# Patient Record
Sex: Male | Born: 1970 | Race: White | Hispanic: No | Marital: Married | Smoking: Current every day smoker
Health system: Southern US, Community
[De-identification: ages and names within clinical notes are randomized; demographics above are authoritative.]

## PROBLEM LIST (undated history)

## (undated) DIAGNOSIS — J189 Pneumonia, unspecified organism: Secondary | ICD-10-CM

## (undated) DIAGNOSIS — Z87442 Personal history of urinary calculi: Secondary | ICD-10-CM

## (undated) DIAGNOSIS — E7800 Pure hypercholesterolemia, unspecified: Secondary | ICD-10-CM

## (undated) DIAGNOSIS — T4145XA Adverse effect of unspecified anesthetic, initial encounter: Secondary | ICD-10-CM

## (undated) DIAGNOSIS — K219 Gastro-esophageal reflux disease without esophagitis: Secondary | ICD-10-CM

## (undated) DIAGNOSIS — K859 Acute pancreatitis without necrosis or infection, unspecified: Secondary | ICD-10-CM

## (undated) DIAGNOSIS — T8859XA Other complications of anesthesia, initial encounter: Secondary | ICD-10-CM

## (undated) HISTORY — PX: OTHER SURGICAL HISTORY: SHX169

## (undated) HISTORY — PX: CHOLECYSTECTOMY: SHX55

## (undated) HISTORY — PX: COLONOSCOPY: SHX174

## (undated) HISTORY — PX: COLONOSCOPY W/ POLYPECTOMY: SHX1380

---

## 2001-07-30 DIAGNOSIS — J189 Pneumonia, unspecified organism: Secondary | ICD-10-CM

## 2001-07-30 HISTORY — DX: Pneumonia, unspecified organism: J18.9

## 2002-07-24 ENCOUNTER — Encounter: Payer: Self-pay | Admitting: Emergency Medicine

## 2002-07-24 ENCOUNTER — Emergency Department (HOSPITAL_COMMUNITY): Admission: EM | Admit: 2002-07-24 | Discharge: 2002-07-24 | Payer: Self-pay | Admitting: Emergency Medicine

## 2004-09-28 ENCOUNTER — Ambulatory Visit: Payer: Self-pay | Admitting: Internal Medicine

## 2004-09-29 ENCOUNTER — Ambulatory Visit: Payer: Self-pay | Admitting: Internal Medicine

## 2004-12-22 ENCOUNTER — Ambulatory Visit: Payer: Self-pay | Admitting: Internal Medicine

## 2005-03-09 ENCOUNTER — Ambulatory Visit: Payer: Self-pay | Admitting: Internal Medicine

## 2005-03-12 ENCOUNTER — Ambulatory Visit: Payer: Self-pay

## 2005-03-19 ENCOUNTER — Ambulatory Visit: Payer: Self-pay | Admitting: Gastroenterology

## 2005-03-20 ENCOUNTER — Ambulatory Visit: Payer: Self-pay | Admitting: Gastroenterology

## 2005-03-20 ENCOUNTER — Encounter (INDEPENDENT_AMBULATORY_CARE_PROVIDER_SITE_OTHER): Payer: Self-pay | Admitting: Specialist

## 2005-03-21 ENCOUNTER — Ambulatory Visit: Payer: Self-pay | Admitting: Gastroenterology

## 2005-03-23 ENCOUNTER — Ambulatory Visit: Payer: Self-pay | Admitting: Internal Medicine

## 2005-03-23 ENCOUNTER — Ambulatory Visit: Payer: Self-pay | Admitting: Gastroenterology

## 2005-03-24 ENCOUNTER — Ambulatory Visit: Payer: Self-pay | Admitting: Gastroenterology

## 2005-03-24 ENCOUNTER — Inpatient Hospital Stay (HOSPITAL_COMMUNITY): Admission: EM | Admit: 2005-03-24 | Discharge: 2005-03-26 | Payer: Self-pay | Admitting: Emergency Medicine

## 2005-03-26 ENCOUNTER — Ambulatory Visit: Payer: Self-pay | Admitting: Gastroenterology

## 2005-04-04 ENCOUNTER — Ambulatory Visit: Payer: Self-pay | Admitting: Internal Medicine

## 2005-04-09 ENCOUNTER — Ambulatory Visit: Payer: Self-pay | Admitting: Gastroenterology

## 2005-04-20 ENCOUNTER — Ambulatory Visit: Payer: Self-pay | Admitting: Gastroenterology

## 2005-05-25 ENCOUNTER — Ambulatory Visit (HOSPITAL_COMMUNITY): Admission: RE | Admit: 2005-05-25 | Discharge: 2005-05-25 | Payer: Self-pay | Admitting: General Surgery

## 2005-05-25 ENCOUNTER — Encounter (INDEPENDENT_AMBULATORY_CARE_PROVIDER_SITE_OTHER): Payer: Self-pay | Admitting: *Deleted

## 2005-12-28 ENCOUNTER — Ambulatory Visit: Payer: Self-pay | Admitting: Internal Medicine

## 2010-08-20 ENCOUNTER — Encounter: Payer: Self-pay | Admitting: Gastroenterology

## 2010-12-15 NOTE — Op Note (Signed)
NAMEDRADEN, COTTINGHAM                ACCOUNT NO.:  [id number]   MEDICAL RECORD NO.:  [id number]          PATIENT TYPE:  AMB   LOCATION:  SDS                          FACILITY:  [facility]   PHYSICIAN:  Angelia Mould. Derrell Lolling, M.D.DATE OF BIRTH:  Mar 19, 1971   DATE OF PROCEDURE:  05/25/2005  DATE OF DISCHARGE:                                 OPERATIVE REPORT   PREOPERATIVE DIAGNOSIS:  Biliary dyskinesia with history of pancreatitis.   POSTOPERATIVE DIAGNOSIS:  Biliary dyskinesia with history of pancreatitis.   OPERATION PERFORMED:  Laparoscopic cholecystectomy with intraoperative  cholangiogram.   SURGEON:  Claud Kelp, M.D.   FIRST ASSISTANT:  Manus Rudd, M.D.   OPERATIVE INDICATIONS:  This is a 40 year old white man in good health  In  August of this year, he had severe epigastric and right upper quadrant pain  requiring hospitalization for 3-1/2 days.  A CT scan shows some vague  haziness at the head of the pancreas associated elevated lipase and mildly  elevated SGPT, and he was thought to have mild pancreatitis.  His  gallbladder ultrasound was negative, but a hepatobiliary scan was markedly  abnormal, in that there was a 0% ejection fraction at 60 minutes.  He was  evaluated by Dr. Claudette Head.  He felt that the patient had biliary  pancreatitis.  He was referred for cholecystectomy electively and he is  operated upon electively.   OPERATIVE FINDINGS:  The gallbladder was chronically inflamed, somewhat  thick-walled and had adhesions to the body and infundibulum, suggesting  prior inflammatory episodes.  The cholangiogram showed a normal biliary  tree, both intrahepatic and extrahepatic, no filling defects, and no  obstruction with good flow of contrast into the duodenum.  The  intrapancreatic bile duct was smooth and tapered, and looked fine.  The  stomach, duodenum, large intestine, small intestine and peritoneal surfaces  were normal to inspection.   OPERATIVE TECHNIQUE:   Following the induction of general endotracheal  anesthesia, the patient's abdomen was prepped and draped in a sterile  fashion.  0.5% Marcaine with epinephrine was used as a local infiltration  anesthetic.  A 10-mm Hasson trocar was inserted through a small vertical  incision just above the umbilicus under direct vision.  This was secured  with a pursestring suture of 0 Vicryl.  Pneumoperitoneum was created and a  video camera was inserted.  A 10-mm trocar was placed in the subxiphoid  region.  Two 5-mm trocars were placed in the right mid-abdomen.  The  gallbladder fundus was identified and elevated.  We spent some time taking  down the adhesions, but ultimately we could identify the infundibulum of the  gallbladder and retract it laterally.  There were some adhesions of the  duodenum to the gallbladder, but they were very soft and easy to take down.  We dissected out the cystic duct and the cystic artery.  The cystic artery  was isolated and secured multiple metal clips and divided close to the  gallbladder.  A nice window was created behind the cystic duct and then the  cystic duct was clipped with a  metal clip close to the gallbladder.  A  cholangiogram catheter was inserted into the cystic duct.  A cholangiogram  was obtained using the C-arm.  The cholangiogram showed normal intrahepatic  and extrahepatic biliary anatomy, no filling defects, and good flow of  contrast into the duodenum without any pressure whatsoever.  The  cholangiogram catheter was removed.  The cystic duct was secured with  multiple metal clips and divided.  The gallbladder was dissected from its  bed and removed through the umbilical port.  The operative field was  copiously irrigated and inspected repeatedly.  At the completion of the  case, there was no bleeding and no bile leak whatsoever and the irrigation  fluid was completely clear.  The trocars were removed under direct vision  and there was no bleeding  from the trocar sites.  Pneumoperitoneum was  released.  The fascia at the umbilicus was closed with 0 Vicryl sutures.  Skin incision were closed with subcuticular sutures of 4-0 Monocryl and  Steri-Strips.  Clean bandages were placed and the patient taken to the  recovery room in stable condition.  Estimated blood loss was about 10 mL.  Complications -- none.  Sponge and instrument counts were correct.      Angelia Mould. Derrell Lolling, M.D.  Electronically Signed     HMI/MEDQ  D:  05/25/2005  T:  05/26/2005  Job:  [id number]   cc:   Wanda Plump, MD [facility]  [phone] [place], [place] [address]   Venita Lick. Russella Dar, M.D. [facility]  [address]  [place]  [place] [address]

## 2010-12-15 NOTE — Discharge Summary (Signed)
[name], [name]                ACCOUNT NO.:  [id number]   MEDICAL RECORD NO.:  [id number]          PATIENT TYPE:  INP   LOCATION:  1511                         [facility]ACILITY[facility]  [facility]   PHYSICIAN:  Rene Paci, M.D. LHCDATE OF BIRTH:  February 13, 1971   DATE OF ADMISSION:  03/25/2007  DATE OF DISCHARGE:  03/26/2005                                 DISCHARGE SUMMARY   DISCHARGE DIAGNOSES:  1.  Acute pancreatitis presumed secondary to microlithiasis, to remain off      Statin as prior to admission.  2.  Dyslipidemia.   DISCHARGE MEDICATIONS:  Ultram as needed, Flexeril as needed, Tylenol as  needed and Prevacid 10 mg daily as needed.   DISPOSITION:  The patient is discharged home tolerating a regular diet,  medically stable condition. Pain, nausea, vomiting have resolved.  Understands plans for follow-up.   FOLLOW-UP:  With primary care physician Dr. Willow Ora for next week September  6 to reevaluate other treatment for dyslipidemia as well as symptoms of  abdominal pain related to pancreatitis. See details below for elective  cholecystectomy to be arranged with surgery in the next 2-3 weeks.   CONSULTATIONS:  [facility], Dr. Marina Goodell.   TESTS:  CT abdomen and pelvis on August 25 showing pancreatic head  pancreatitis. No other acute abnormality within the abdomen or pelvis. HIDA  __________ scan pending at time of discharge, but his symptoms resolved and  for elective cholecystectomy final results will need to be followed up by  primary MD.   HOSPITAL COURSE BY PROBLEM:  PANCREATITIS.  The patient is a pleasant 40-  year-old gentleman who had been taking Vytoran for his dyslipidemia but off  this medication for 3 weeks secondary to question reactions. Seen in  outpatient evaluation by Dr. Marina Goodell for the same and for continued outpatient  workup when he experienced a sudden increase in the intensity of his  abdominal pain associated with nausea. Found in the ER to have elevated  amylase  and lipase consistent with biochemical pancreatitis despite being  off the Statin and this was confirmed with pancreatic head pancreatic  changes on CT abdomen and pelvis. He was seen in consultation by GI and  admitted for pain control. The patient was left n.p.o. pending the  improvement of the symptoms and a HIDA scan was scheduled to rule out  microlithiasis given persistent pancreatitis despite discontinuation of  Statin. He underwent a HIDA scan on the day of discharge and has he has had  no further pain, nausea or vomiting for to days and tolerating a regular low-  fat diet, he is felt stable for discharge home with outpatient follow-up  with surgery to consider elective cholecystectomy given persistent  pancreatitis symptoms requiring hospitalization. He is to remain off the  Statin and further follow-up primary MD regarding other treatment of his  dyslipidemia.   LABORATORY DATA AT TIME OF DISCHARGE:  On August 27 the day before  discharge, lipase decreased to 145, CMET normal with the exception of ALT  mildly elevated at 42. All other LFTs normal. CBC normal with a white count  of 9.5. Hemoglobin 14.7 and platelet count of 285.      Rene Paci, M.D. [facility]  Electronically Signed     VL/MEDQ  D:  03/26/2005  T:  03/27/2005  Job:  [id number]

## 2010-12-15 NOTE — H&P (Signed)
[name], Downs NO.:  [id number]   MEDICAL RECORD NO.:  [id number]          PATIENT TYPE:  EMS   LOCATION:  ED                           FACILITY:  [facility]   PHYSICIAN:  Corwin Levins, M.D. LHCDATE OF BIRTH:  12-18-1970   DATE OF ADMISSION:  03/23/2005  DATE OF DISCHARGE:                                HISTORY & PHYSICAL   CHIEF COMPLAINT:  Fever for three weeks with increasing abdominal pain after  stopping Vytorin 2-1/2 weeks ago.   HISTORY OF PRESENT ILLNESS:  Mr. [name] is a 40 year old white male here with  the above.  His lipase was approximately 200 on August 23, but apparently  the patient was not informed.  He came to the ER tonight with increasingly  unbearable pain radiating to the back.  He was found to have similar labs as  on August 23.  CT of the pancreas showed pancreatitis.  He has had some  nausea but no vomiting.  No orthostatic symptoms.  He has been around the ER  after IV fluids and morphine sulfate.  He had a recent EGD, and ultrasound  was apparently negative, per patient, per Dr. Russella Dar.   PAST MEDICAL HISTORY:  1.  Renal stone.  2.  Hyperlipidemia.  3.  History of colon polyps.   PAST SURGICAL HISTORY:  Status post birthmark surgery, forehead, remotely.   ALLERGIES:  No known drug allergies.   CURRENT MEDICATIONS:  Recent cyclobenzaprine, Ultram and Tylenol, none of  which he has taken in the last 24 hours.   SOCIAL HISTORY:  No tobacco or alcohol.  Lives with wife.  Married.   FAMILY HISTORY:  Negative for pancreatic.   REVIEW OF SYSTEMS:  Otherwise noncontributory.   PHYSICAL EXAMINATION:  GENERAL:  [name] is a 40 year old white male who  is a little groggy after morphine but able to give concise answers.  VITAL SIGNS:  Blood pressure 132/94, heart rate 94, respirations 20.  ENT:  Sclerae are clear.  TM's are clear.  Pharynx benign.  NECK:  No lymph nodes or JVD.  No thyromegaly.  CHEST:  No rales or wheezes.  CARDIAC:  Regular rate and rhythm.  ABDOMEN:  Soft.  Somewhat decreased bowel sounds.  Moderately tender right  upper and left upper quadrants.  No guarding.  EXTREMITIES:  No edema.   LABS:  Lipase 205.  UA negative.  BMET within normal limits except glucose  122.  LFTs within normal limits except SGPT 75.  White blood cell count 9.8,  hemoglobin 16.3.   CT of the abdomen and pelvis consistent with pancreatic head pancreatitis  only, otherwise negative.   ASSESSMENT/PLAN:  Abdominal pain with increasing lipase and CT, as above,  consistent with pancreatitis.  Confirmed by CT.  Apparently, no likelihood  by history of gallstones or alcohol relation.  I therefore think it is most  likely related to Vytorin versus idiopathic.  He is to be admitted.  Given  IV fluids.  Pain control.  Make him n.p.o.  Consider GI disease with  questionable need for further imaging such  as ERCP.           ______________________________  Corwin Levins, M.D. [facility]     JWJ/MEDQ  D:  03/23/2005  T:  03/23/2005  Job:  [id number]   cc:   Wanda Plump, MD [facility]  [phone] [place], [place] [address]   Venita Lick. Russella Dar, M.D. [facility]

## 2014-01-16 ENCOUNTER — Emergency Department (HOSPITAL_COMMUNITY)
Admission: EM | Admit: 2014-01-16 | Discharge: 2014-01-16 | Disposition: A | Discharge status: Home / Self Care | Payer: Managed Care, Other (non HMO) | Attending: Emergency Medicine | Admitting: Emergency Medicine

## 2014-01-16 ENCOUNTER — Emergency Department (HOSPITAL_COMMUNITY): Payer: Managed Care, Other (non HMO)

## 2014-01-16 ENCOUNTER — Encounter (HOSPITAL_COMMUNITY): Payer: Self-pay | Admitting: Emergency Medicine

## 2014-01-16 DIAGNOSIS — Z9089 Acquired absence of other organs: Secondary | ICD-10-CM | POA: Insufficient documentation

## 2014-01-16 DIAGNOSIS — K8500 Idiopathic acute pancreatitis without necrosis or infection: Secondary | ICD-10-CM

## 2014-01-16 DIAGNOSIS — F17200 Nicotine dependence, unspecified, uncomplicated: Secondary | ICD-10-CM | POA: Insufficient documentation

## 2014-01-16 DIAGNOSIS — E7800 Pure hypercholesterolemia, unspecified: Secondary | ICD-10-CM | POA: Insufficient documentation

## 2014-01-16 DIAGNOSIS — K859 Acute pancreatitis without necrosis or infection, unspecified: Secondary | ICD-10-CM | POA: Insufficient documentation

## 2014-01-16 DIAGNOSIS — Z9889 Other specified postprocedural states: Secondary | ICD-10-CM | POA: Insufficient documentation

## 2014-01-16 HISTORY — DX: Acute pancreatitis without necrosis or infection, unspecified: K85.90

## 2014-01-16 HISTORY — DX: Pure hypercholesterolemia, unspecified: E78.00

## 2014-01-16 HISTORY — DX: Gastro-esophageal reflux disease without esophagitis: K21.9

## 2014-01-16 LAB — COMPREHENSIVE METABOLIC PANEL
ALT: 14 U/L (ref 0–53)
AST: 12 U/L (ref 0–37)
Albumin: 3.6 g/dL (ref 3.5–5.2)
Alkaline Phosphatase: 83 U/L (ref 39–117)
BUN: 5 mg/dL — ABNORMAL LOW (ref 6–23)
CO2: 23 mEq/L (ref 19–32)
Calcium: 9.4 mg/dL (ref 8.4–10.5)
Chloride: 100 mEq/L (ref 96–112)
Creatinine, Ser: 0.67 mg/dL (ref 0.50–1.35)
GLUCOSE: 84 mg/dL (ref 70–99)
Potassium: 3.7 mEq/L (ref 3.7–5.3)
Sodium: 137 mEq/L (ref 137–147)
Total Bilirubin: 0.4 mg/dL (ref 0.3–1.2)
Total Protein: 7.5 g/dL (ref 6.0–8.3)

## 2014-01-16 LAB — URINALYSIS, ROUTINE W REFLEX MICROSCOPIC
Bilirubin Urine: NEGATIVE
Glucose, UA: NEGATIVE mg/dL
Hgb urine dipstick: NEGATIVE
Ketones, ur: 15 mg/dL — AB
LEUKOCYTES UA: NEGATIVE
NITRITE: NEGATIVE
PROTEIN: NEGATIVE mg/dL
Specific Gravity, Urine: 1.005 (ref 1.005–1.030)
UROBILINOGEN UA: 0.2 mg/dL (ref 0.0–1.0)
pH: 6.5 (ref 5.0–8.0)

## 2014-01-16 LAB — CBC
HCT: 44.3 % (ref 39.0–52.0)
Hemoglobin: 15.7 g/dL (ref 13.0–17.0)
MCH: 28.6 pg (ref 26.0–34.0)
MCHC: 35.4 g/dL (ref 30.0–36.0)
MCV: 80.8 fL (ref 78.0–100.0)
PLATELETS: 277 10*3/uL (ref 150–400)
RBC: 5.48 MIL/uL (ref 4.22–5.81)
RDW: 12 % (ref 11.5–15.5)
WBC: 9.1 10*3/uL (ref 4.0–10.5)

## 2014-01-16 LAB — LIPASE, BLOOD: LIPASE: 482 U/L — AB (ref 11–59)

## 2014-01-16 MED ORDER — SODIUM CHLORIDE 0.9 % IV BOLUS (SEPSIS)
500.0000 mL | Freq: Once | INTRAVENOUS | Status: AC
  Administered 2014-01-16: 500 mL via INTRAVENOUS

## 2014-01-16 MED ORDER — OXYCODONE-ACETAMINOPHEN 5-325 MG PO TABS: 1.0000 | ORAL_TABLET | Freq: Four times a day (QID) | ORAL | Status: DC | PRN

## 2014-01-16 MED ORDER — HYDROMORPHONE HCL PF 1 MG/ML IJ SOLN
1.0000 mg | Freq: Once | INTRAMUSCULAR | Status: AC
  Administered 2014-01-16: 1 mg via INTRAVENOUS
  Filled 2014-01-16: qty 1

## 2014-01-16 MED ORDER — ONDANSETRON 8 MG PO TBDP: 8.0000 mg | ORAL_TABLET | Freq: Three times a day (TID) | ORAL | Status: DC | PRN

## 2014-01-16 MED ORDER — IOHEXOL 300 MG/ML  SOLN
100.0000 mL | Freq: Once | INTRAMUSCULAR | Status: AC | PRN
  Administered 2014-01-16: 100 mL via INTRAVENOUS

## 2014-01-16 MED ORDER — SODIUM CHLORIDE 0.9 % IV BOLUS (SEPSIS)
1000.0000 mL | Freq: Once | INTRAVENOUS | Status: AC
  Administered 2014-01-16: 1000 mL via INTRAVENOUS

## 2014-01-16 MED ORDER — ONDANSETRON HCL 4 MG/2ML IJ SOLN
4.0000 mg | Freq: Once | INTRAMUSCULAR | Status: AC
  Administered 2014-01-16: 4 mg via INTRAVENOUS
  Filled 2014-01-16: qty 2

## 2014-01-16 MED ORDER — IOHEXOL 300 MG/ML  SOLN
50.0000 mL | Freq: Once | INTRAMUSCULAR | Status: AC | PRN
  Administered 2014-01-16: 50 mL via ORAL

## 2014-01-16 NOTE — ED Provider Notes (Addendum)
CSN: [id number]     Arrival date & time 01/16/14  1635 History   First MD Initiated Contact with Patient 01/16/14 1657     Chief Complaint  Patient presents with  . Flank Pain     (Consider location/radiation/quality/duration/timing/severity/associated sxs/prior Treatment) Patient is a 43 y.o. male presenting with flank pain. The history is provided by the patient and the spouse.  Flank Pain Pertinent negatives include no chest pain, no abdominal pain, no headaches and no shortness of breath.  pt w hx pancreatitis, hx kidney stone, c/o upper abd and left flank pain in the past week. Constant, but waxes and wanes in severity. Occurs at rest. No relation to activity or exertion. No relation to position. Nausea. No vomiting. Having normal bms. No diarrhea or constipation. No dysuria or hematuria. No pleuritic pain or chest pain. No cough, or uri c/o. No sob. States was seen by pcp and told lipase high and suspected pancreatitis. Has been on brat diet, pain rx, but states pain no better/worse. Denies fever or chills. No acute or abrupt worsening in pain today, but persistent.  Only prior abd surgery is remote hx cholecystectomy.     Past Medical History  Diagnosis Date  . Pancreatitis   . Hypercholesteremia   . GERD (gastroesophageal reflux disease)    Past Surgical History  Procedure Laterality Date  . Cholecystectomy    . Colonoscopy     No family history on file. History  Substance Use Topics  . Smoking status: Current Every Day Smoker -- 0.50 packs/day    Types: Cigarettes  . Smokeless tobacco: Not on file  . Alcohol Use: Yes     Comment: socially    Review of Systems  Constitutional: Negative for fever.  HENT: Negative for sore throat.   Eyes: Negative for redness.  Respiratory: Negative for cough and shortness of breath.   Cardiovascular: Negative for chest pain and leg swelling.  Gastrointestinal: Negative for vomiting, abdominal pain and diarrhea.  Genitourinary:  Positive for flank pain. Negative for dysuria and hematuria.  Musculoskeletal: Negative for back pain and neck pain.  Skin: Negative for rash.  Neurological: Negative for headaches.  Hematological: Does not bruise/bleed easily.  Psychiatric/Behavioral: Negative for confusion.      Allergies  Codeine  Home Medications   Prior to Admission medications   Not on File   BP 132/90  Pulse 88  Temp(Src) 98.1 F (36.7 C) (Oral)  Resp 16  SpO2 97% Physical Exam  Nursing note and vitals reviewed. Constitutional: He is oriented to person, place, and time. He appears well-developed and well-nourished. No distress.  HENT:  Mouth/Throat: Oropharynx is clear and moist.  Eyes: Conjunctivae are normal. No scleral icterus.  Neck: Neck supple. No tracheal deviation present.  Cardiovascular: Normal rate, regular rhythm, normal heart sounds and intact distal pulses.   Pulmonary/Chest: Effort normal and breath sounds normal. No accessory muscle usage. No respiratory distress.  Abdominal: Soft. Bowel sounds are normal. He exhibits no distension and no mass. There is tenderness. There is no rebound and no guarding.  Epigastric and luq tenderness.   Genitourinary:  No cva tenderness  Musculoskeletal: Normal range of motion. He exhibits no edema and no tenderness.  Neurological: He is alert and oriented to person, place, and time.  Skin: Skin is warm and dry. No rash noted. He is not diaphoretic.  Psychiatric: He has a normal mood and affect.    ED Course  Procedures (including critical care time) Labs Review  Results  for orders placed during the hospital encounter of 01/16/14  CBC      Result Value Ref Range   WBC 9.1  4.0 - 10.5 K/uL   RBC 5.48  4.22 - 5.81 MIL/uL   Hemoglobin 15.7  13.0 - 17.0 g/dL   HCT 16.144.3  09.639.0 - 04.552.0 %   MCV 80.8  78.0 - 100.0 fL   MCH 28.6  26.0 - 34.0 pg   MCHC 35.4  30.0 - 36.0 g/dL   RDW 40.912.0  81.111.5 - 91.415.5 %   Platelets 277  150 - 400 K/uL  COMPREHENSIVE  METABOLIC PANEL      Result Value Ref Range   Sodium 137  137 - 147 mEq/L   Potassium 3.7  3.7 - 5.3 mEq/L   Chloride 100  96 - 112 mEq/L   CO2 23  19 - 32 mEq/L   Glucose, Bld 84  70 - 99 mg/dL   BUN 5 (*) 6 - 23 mg/dL   Creatinine, Ser 7.820.67  0.50 - 1.35 mg/dL   Calcium 9.4  8.4 - 95.610.5 mg/dL   Total Protein 7.5  6.0 - 8.3 g/dL   Albumin 3.6  3.5 - 5.2 g/dL   AST 12  0 - 37 U/L   ALT 14  0 - 53 U/L   Alkaline Phosphatase 83  39 - 117 U/L   Total Bilirubin 0.4  0.3 - 1.2 mg/dL   GFR calc non Af Amer >90  >90 mL/min   GFR calc Af Amer >90  >90 mL/min  LIPASE, BLOOD      Result Value Ref Range   Lipase 482 (*) 11 - 59 U/L  URINALYSIS, ROUTINE W REFLEX MICROSCOPIC      Result Value Ref Range   Color, Urine YELLOW  YELLOW   APPearance CLEAR  CLEAR   Specific Gravity, Urine 1.005  1.005 - 1.030   pH 6.5  5.0 - 8.0   Glucose, UA NEGATIVE  NEGATIVE mg/dL   Hgb urine dipstick NEGATIVE  NEGATIVE   Bilirubin Urine NEGATIVE  NEGATIVE   Ketones, ur 15 (*) NEGATIVE mg/dL   Protein, ur NEGATIVE  NEGATIVE mg/dL   Urobilinogen, UA 0.2  0.0 - 1.0 mg/dL   Nitrite NEGATIVE  NEGATIVE   Leukocytes, UA NEGATIVE  NEGATIVE   Ct Abdomen Pelvis W Contrast  01/16/2014   CLINICAL DATA:  Left flank pain pancreatitis.  EXAM: CT ABDOMEN AND PELVIS WITH CONTRAST  TECHNIQUE: Multidetector CT imaging of the abdomen and pelvis was performed using the standard protocol following bolus administration of intravenous contrast.  CONTRAST:  50mL OMNIPAQUE IOHEXOL 300 MG/ML SOLN, 100mL OMNIPAQUE IOHEXOL 300 MG/ML SOLN  COMPARISON:  CT 03/23/2005  FINDINGS: Lung bases are clear.  No pericardial fluid.  No focal hepatic lesion. Post cholecystectomy. No intrahepatic biliary duct dilatation. The common bile duct is normal caliber. There is mild inflammation surrounding the pancreas body and tail. No organized fluid collections. Normal pancreatic enhancement.  The spleen, adrenal glands, and kidneys are normal.  The stomach,  small bowel, and colon are unremarkable.  Abdominal aorta is normal caliber. No retroperitoneal periportal lymphadenopathy. No free fluid the pelvis. The prostate gland and bladder normal. No pelvic lymphadenopathy. No aggressive osseous lesion.  IMPRESSION: 1. Mild acute pancreatitis. No organized fluid collection. No vascular complication. No evidence of pancreatic necrosis. 2. Patient status post cholecystectomy.   Electronically Signed   By: Genevive BiStewart  Edmunds M.D.   On: 01/16/2014 18:45  MDM  Iv ns bolus. zofran iv. Dilaudid 1 mg iv. Pt has ride, does not have to drive.  Labs. Imaging.  Reviewed nursing notes and prior charts for additional history.   Recheck pt feels much improved. No nv. abd soft nt.  Discussed labs and ct.  Pt appears stable for d/c.      Suzi Roots, MD 01/16/14 [phone]

## 2014-01-16 NOTE — ED Notes (Signed)
Pt from home c/o L flank pain. Pt states that he has had abd pain since June 11th. Pt saw PCP 3 days ago and was dx with pancreatitis. Pt has been trying to tx at home with BRAT diet w/o success. Pt takes 800mg  ibuprofen every 8 hrs for pain but is not controlling pain. Pt is here because he "is tired of hurting." Pt denies diarrhea, emesis, CP, SOB. Pt is A&O and in NAD. Pt spouse at bedside

## 2014-01-16 NOTE — Discharge Instructions (Signed)
Rest. Drink plenty of fluids, clear liquid diet, advance diet as tolerated. You may take percocet as need for pain. No driving for the next 6 hours or when taking percocet. Also, do not take tylenol or acetaminophen containing medication when taking percocet.  Take zofran as need for nausea. Follow up with primary care doctor in coming week if symptoms fail to improve/resolve. Return to ER if worse, new symptoms, fevers, persistent vomiting, other concern.    Acute Pancreatitis Acute pancreatitis is a disease in which the pancreas becomes suddenly inflamed. The pancreas is a large gland located behind your stomach. The pancreas produces enzymes that help digest food. The pancreas also releases the hormones glucagon and insulin that help regulate blood sugar. Damage to the pancreas occurs when the digestive enzymes from the pancreas are activated and begin attacking the pancreas before being released into the intestine. Most acute attacks last a couple of days. CAUSES  Pancreatitis can happen to anyone. In some cases, the cause is unknown. Most cases are caused by:  Alcohol abuse.  Gallstones. Other less common causes are:  Certain medicines.  Exposure to certain chemicals.  Infection.  Damage caused by an accident (trauma).  Abdominal surgery. SYMPTOMS   Pain in the upper abdomen that may radiate to the back.  Tenderness and swelling of the abdomen.  Nausea and vomiting. DIAGNOSIS  Your caregiver will perform a physical exam. Blood and stool tests may be done to confirm the diagnosis. Imaging tests may also be done, such as X-rays, CT scans, or an ultrasound of the abdomen. TREATMENT  Treatment usually requires a stay in the hospital. Treatment may include:  Pain medicine.  Fluid replacement through an intravenous line (IV).  Placing a tube in the stomach to remove stomach contents and control vomiting.  Not eating for 3 or 4 days. This gives your pancreas a rest, because  enzymes are not being produced that can cause further damage.  Antibiotic medicines if your condition is caused by an infection.  Surgery of the pancreas or gallbladder. HOME CARE INSTRUCTIONS   Follow the diet advised by your caregiver. This may involve avoiding alcohol and decreasing the amount of fat in your diet.  Eat smaller, more frequent meals. This reduces the amount of digestive juices the pancreas produces.  Drink enough fluids to keep your urine clear or pale yellow.  Only take over-the-counter or prescription medicines as directed by your caregiver.  Avoid drinking alcohol if it caused your condition.  Do not smoke.  Get plenty of rest.  Check your blood sugar at home as directed by your caregiver.  Keep all follow-up appointments as directed by your caregiver. SEEK MEDICAL CARE IF:   You do not recover as quickly as expected.  You develop new or worsening symptoms.  You have persistent pain, weakness, or nausea.  You recover and then have another episode of pain. SEEK IMMEDIATE MEDICAL CARE IF:   You are unable to eat or keep fluids down.  Your pain becomes severe.  You have a fever or persistent symptoms for more than 2 to 3 days.  You have a fever and your symptoms suddenly get worse.  Your skin or the white part of your eyes turn yellow (jaundice).  You develop vomiting.  You feel dizzy, or you faint.  Your blood sugar is high (over 300 mg/dL). MAKE SURE YOU:   Understand these instructions.  Will watch your condition.  Will get help right away if you are not doing  well or get worse. Document Released: 07/16/2005 Document Revised: 01/15/2012 Document Reviewed: 10/25/2011 [facility] Patient Information 2015 [place], [place]. This information is not intended to replace advice given to you by your health care provider. Make sure you discuss any questions you have with your health care provider.

## 2014-01-16 NOTE — ED Notes (Signed)
Pt is aware that a urine sample is needed.  

## 2016-07-17 ENCOUNTER — Ambulatory Visit: Payer: Managed Care, Other (non HMO) | Attending: Orthopedic Surgery | Admitting: Physical Therapy

## 2016-07-17 ENCOUNTER — Encounter: Payer: Self-pay | Admitting: Physical Therapy

## 2016-07-17 DIAGNOSIS — M542 Cervicalgia: Secondary | ICD-10-CM | POA: Diagnosis present

## 2016-07-17 DIAGNOSIS — M62830 Muscle spasm of back: Secondary | ICD-10-CM | POA: Insufficient documentation

## 2016-07-17 NOTE — Therapy (Signed)
[facility] [place] [facility] [address] [place], [place], [address] Phone: [phone]   Fax:  [phone]  Physical Therapy Evaluation  Patient Details  Name: [name] MRN: [id number] Date of Birth: [birth year] Referring Provider: Dr. Ranell Patrick  Encounter Date: 07/17/2016      PT End of Session - 07/17/16 1151    Visit Number 1   Date for PT Re-Evaluation 09/17/16   PT Start Time 1100   PT Stop Time 1200   PT Time Calculation (min) 60 min   Activity Tolerance Patient tolerated treatment well;No increased pain   Behavior During Therapy WFL for tasks assessed/performed      Past Medical History:  Diagnosis Date  . GERD (gastroesophageal reflux disease)   . Hypercholesteremia   . Pancreatitis     Past Surgical History:  Procedure Laterality Date  . CHOLECYSTECTOMY    . COLONOSCOPY      There were no vitals filed for this visit.       Subjective Assessment - 07/17/16 1057    Subjective Pt states that his neck pain begins in the shoulder blades (pointing around CT junction) and it radiates down the left arm down to the 4th and 5th digit. 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 These symptoms began abround a year and a half ago however this episode began around Thanksgiving. He tried steroid treatment in April 2016. This alleviated symptoms fully for about 7-8 months.  The pain is worst in the morning and the pain decreases throughout the day. At the end of the pain begins to get progressively worse again.   Limitations Other (comment)  sleeping   Currently in Pain? Yes   Pain Score 2   at worst: 7/10   Pain Location Neck   Pain Orientation Left   Pain Descriptors / Indicators Aching   Pain Type Other (Comment)  acute on chronic   Pain Radiating Towards left arm to 4th and 5th digits   Pain Onset More than a month ago   Pain Frequency Constant    Aggravating Factors  sleeping, fatigue   Pain Relieving Factors side bending, steroids (currently not taking), medication   Multiple Pain Sites Yes   Pain Score 3   Pain Location Shoulder   Pain Orientation Left   Pain Descriptors / Indicators Radiating            [facility] PT Assessment - 07/17/16 0001      Assessment   Medical Diagnosis Cervicalgia   Referring Provider Dr. Joneen Boers Dominance Right   Next MD Visit 07/26/16   Prior Therapy no     Balance Screen   Has the patient fallen in the past 6 months No   Has the patient had a decrease in activity level because of a fear of falling?  No   Is the patient reluctant to leave their home because of a fear of falling?  No     Prior Function   Vocation Full time employment   Market researcher, sititng behind desk, flying on planes alot   Leisure riding dirt bikes with kids     ROM / Strength   AROM / PROM / Strength AROM;Strength     AROM   Overall AROM  Unable to assess   Overall AROM Comments Cervical extension caused worst pain and flexion caused slight increase but then stopped, all  the movement is causing radiating symptoms.   AROM Assessment Site Cervical;Shoulder   Cervical Flexion 30   Cervical Extension 45   Cervical - Right Side Bend 55   Cervical - Left Side Bend 55   Cervical - Right Rotation 70   Cervical - Left Rotation 70     Palpation   Spinal mobility TTP CT junction   Palpation comment TTP left scapula region over infraspinatus and mid trap                   [facility] - 07/17/16 0001      Exercises   Exercises Shoulder;Neck     Shoulder Exercises: Stretch   Corner Stretch 20 seconds;1 rep     Modalities   Modalities Electrical Stimulation;Moist Heat     Moist Heat Therapy   Number Minutes Moist Heat 15 Minutes   Moist Heat Location Cervical     Electrical Stimulation   Electrical Stimulation Location left scap, left cervical    Electrical Stimulation Action IFC   Electrical Stimulation Parameters supine   Electrical Stimulation Goals Pain     Manual Therapy   Manual Therapy Passive ROM   Passive ROM Upper trap stretch, suboccipital release                PT Education - 07/17/16 1151    Education provided Yes   Education Details HEP with trap stretches, chin tuck, pec stretches   Person(s) Educated Patient   Methods Explanation;Handout;Demonstration   Comprehension Verbalized understanding          PT Short Term Goals - 07/17/16 1210      PT SHORT TERM GOAL #1   Title Pt will demonstrtae proper recall of HEP.   Time 1   Period Weeks   Status New           PT Long Term Goals - 07/17/16 1210      PT LONG TERM GOAL #1   Title Pt will report 50% decrease in worst pain rating.   Time 8   Period Weeks   Status New     PT LONG TERM GOAL #2   Title Pt will understand workspace and daily ergonomics in order to decrease stress felt on cervical neck region.   Time 8   Period Weeks   Status New     PT LONG TERM GOAL #3   Title Pt will be able to work a full day without reports of fatigue and symptom increase at the end of the day.   Time 8   Period Weeks   Status New               Plan - 07/17/16 1152    Clinical Impression Statement Pt presents with neck pain which is mainly on the left side and radiates down the left arm at times. Pt lacks some AROM with extension which also caused the most  Pt was educated on Training and development officerworkspace ergonomics and daily eronomic changes that may decrease the amount of stress throuhg the neck region due to constant neck flexion and looking downward. Pt reported decreased symptoms while on the estim machine however when he sat up, the radiating symptoms of the left UE came back. The pain was localized to the lateral portion of the upper arm. Trigger points were felt in the left upper scapular region and he stated that this is where he feels the pain originates  from. Upper limb tension tests were  not fully tested however ulnar nerve strtech was negative for symptom provocation.    Rehab Potential Good   PT Frequency 2x / week   PT Duration 8 weeks   PT Treatment/Interventions Moist Heat;Traction;ADLs/Self Care Home Management;Electrical Stimulation;Cryotherapy;Functional mobility training;Therapeutic activities;Therapeutic exercise;Patient/family education;Passive range of motion;Manual techniques;Energy conservation   PT Next Visit Plan ULT tests, distraction vs compression, scapular stabilization exercises, chin tucks, pain management   PT Home Exercise Plan Upper trap stretch, chin tucks   Consulted and Agree with Plan of Care Patient      Patient will benefit from skilled therapeutic intervention in order to improve the following deficits and impairments:  Pain, Postural dysfunction, Improper body mechanics, Increased muscle spasms, Impaired flexibility, Hypomobility, Decreased range of motion, Decreased mobility  Visit Diagnosis: Cervicalgia  Muscle spasm of back     Problem List There are no active problems to display for this patient.   Arsenio LoaderBrian Cade Lindstromsley, SPT 07/17/2016, 12:15 PM  [facility] [place] [facility] [address] [place], [place], [address] Phone: [phone]   Fax:  [phone]  Name: [name] MRN: [id number] Date of Birth: 11/05/1970

## 2016-07-19 ENCOUNTER — Ambulatory Visit: Payer: Managed Care, Other (non HMO) | Admitting: Physical Therapy

## 2016-07-19 ENCOUNTER — Encounter: Payer: Self-pay | Admitting: Physical Therapy

## 2016-07-19 DIAGNOSIS — M542 Cervicalgia: Secondary | ICD-10-CM

## 2016-07-19 DIAGNOSIS — M62830 Muscle spasm of back: Secondary | ICD-10-CM

## 2016-07-19 NOTE — Therapy (Signed)
[facility] [place] [address] [place], [place], [address] Phone: [phone]   Fax:  [phone]  Physical Therapy Treatment  Patient Details  Name: [name] MRN: [id number] Date of Birth: 05/10/1971 Referring Provider: Dr. Ranell PatrickNorris  Encounter Date: 07/19/2016      PT End of Session - 07/19/16 1550    Visit Number 2   Date for PT Re-Evaluation 09/17/16   PT Start Time 1525   PT Stop Time 1620   PT Time Calculation (min) 55 min      Past Medical History:  Diagnosis Date  . GERD (gastroesophageal reflux disease)   . Hypercholesteremia   . Pancreatitis     Past Surgical History:  Procedure Laterality Date  . CHOLECYSTECTOMY    . COLONOSCOPY      There were no vitals filed for this visit.      Subjective Assessment - 07/19/16 1529    Subjective hurting-neck and radiating pain   Currently in Pain? Yes   Pain Score 5    Pain Location Neck   Pain Orientation Left                         [facility] Adult PT Treatment/Exercise - 07/19/16 0001      Neck Exercises: Machines for Strengthening   UBE (Upper Arm Bike) L 3 2 fwd/2 back     Neck Exercises: Standing   Neck Retraction 5 reps;3 secs   Other Standing Exercises red tband scap stab 15 times 3 ways   Other Standing Exercises ball rev wall CC and CW 5 times     Modalities   Modalities Traction     Moist Heat Therapy   Number Minutes Moist Heat 15 Minutes   Moist Heat Location Cervical     Electrical Stimulation   Electrical Stimulation Location left scap, left cervical   Electrical Stimulation Action IFC   Electrical Stimulation Parameters supine   Electrical Stimulation Goals Pain     Traction   Type of Traction Cervical   Min (lbs) 15   Time 15                  PT Short Term Goals - 07/19/16 1550      PT SHORT TERM GOAL #1   Title Pt will demonstrtae proper recall of HEP.   Status Achieved           PT  Long Term Goals - 07/17/16 1210      PT LONG TERM GOAL #1   Title Pt will report 50% decrease in worst pain rating.   Time 8   Period Weeks   Status New     PT LONG TERM GOAL #2   Title Pt will understand workspace and daily ergonomics in order to decrease stress felt on cervical neck region.   Time 8   Period Weeks   Status New     PT LONG TERM GOAL #3   Title Pt will be able to work a full day without reports of fatigue and symptom increase at the end of the day.   Time 8   Period Weeks   Status New               Plan - 07/19/16 1550    Clinical Impression Statement pt verb compliance with HEP. Very tight in cerv area, negative NT stretching. pt verb best he has felt in awhile from  traction ( gave info re ordering home traction)   PT Next Visit Plan progress ex and continue with modalities      Patient will benefit from skilled therapeutic intervention in order to improve the following deficits and impairments:     Visit Diagnosis: Cervicalgia  Muscle spasm of back     Problem List There are no active problems to display for this patient.   [name],ANGIE PTA 07/19/2016, 4:04 PM  [facility] [place] [address] [place], [place], [address] Phone: [phone]   Fax:  [phone]  Name: [name] MRN: [id number] Date of Birth: 09/06/1970

## 2016-07-24 ENCOUNTER — Encounter: Payer: Self-pay | Admitting: Physical Therapy

## 2016-07-24 ENCOUNTER — Ambulatory Visit: Payer: Managed Care, Other (non HMO) | Admitting: Physical Therapy

## 2016-07-24 DIAGNOSIS — M542 Cervicalgia: Secondary | ICD-10-CM

## 2016-07-24 DIAGNOSIS — M62830 Muscle spasm of back: Secondary | ICD-10-CM

## 2016-07-24 NOTE — Therapy (Signed)
[facility] [address] [place], [place], [address] Phone: [phone]   Fax:  [phone]  Physical Therapy Treatment  Patient Details  Name: Brian IronKevin L Marcom MRN: [id number] Date of Birth: 08/23/1970 Referring Provider: Dr. Ranell PatrickNorris  Encounter Date: 07/24/2016      PT End of Session - 07/24/16 0821    Visit Number 3   Date for PT Re-Evaluation 09/17/16   PT Start Time 0755   PT Stop Time 0850   PT Time Calculation (min) 55 min   Activity Tolerance Patient tolerated treatment well;No increased pain   Behavior During Therapy WFL for tasks assessed/performed      Past Medical History:  Diagnosis Date  . GERD (gastroesophageal reflux disease)   . Hypercholesteremia   . Pancreatitis     Past Surgical History:  Procedure Laterality Date  . CHOLECYSTECTOMY    . COLONOSCOPY      There were no vitals filed for this visit.      Subjective Assessment - 07/24/16 0756    Subjective I think the traction helped   Currently in Pain? Yes   Pain Score 4    Pain Location Scapula   Pain Orientation Left   Pain Descriptors / Indicators Aching;Sore;Tender   Pain Type Acute pain   Aggravating Factors  traction seemed to help                         [facility]/Ex[facility] - 07/24/16 0001      Neck Exercises: Machines for Strengthening   UBE (Upper Arm Bike) L 6 2 fwd/2 back   Cybex Row 25# 2x15   Other Machines for Strengthening lat pulls 25# 2x15     Neck Exercises: Theraband   Shoulder External Rotation 20 reps;Red   Horizontal ABduction 20 reps;Red     Neck Exercises: Supine   Neck Retraction 20 reps   Neck Retraction Limitations head on ball     Moist Heat Therapy   Number Minutes Moist Heat 15 Minutes   Moist Heat Location Shoulder  left scapular area     Electrical Stimulation   Electrical Stimulation Location left scap, left cervical   Electrical Stimulation Action IFC   Electrical Stimulation Parameters supine   Electrical Stimulation Goals Pain     Traction   Type of Traction Cervical   Min (lbs) 15   Hold Time static   Time 15                  PT Short Term Goals - 07/19/16 1550      PT SHORT TERM GOAL #1   Title Pt will demonstrtae proper recall of HEP.   Status Achieved           PT Long Term Goals - 07/17/16 1210      PT LONG TERM GOAL #1   Title Pt will report 50% decrease in worst pain rating.   Time 8   Period Weeks   Status New     PT LONG TERM GOAL #2   Title Pt will understand workspace and daily ergonomics in order to decrease stress felt on cervical neck region.   Time 8   Period Weeks   Status New     PT LONG TERM GOAL #3   Title Pt will be able to work a full day without reports of fatigue and symptom increase at the end of the day.  Time 8   Period Weeks   Status New               Plan - 07/24/16 [id number]    Clinical Impression Statement Good form with cues for exercises, he is tender with a possible trigger point in the rhomboid and the teres on the left   PT Home Exercise Plan Upper trap stretch, chin tucks   Consulted and Agree with Plan of Care Patient      Patient will benefit from skilled therapeutic intervention in order to improve the following deficits and impairments:  Pain, Postural dysfunction, Improper body mechanics, Increased muscle spasms, Impaired flexibility, Hypomobility, Decreased range of motion, Decreased mobility  Visit Diagnosis: Cervicalgia  Muscle spasm of back     Problem List There are no active problems to display for this patient.   Jearld LeschALBRIGHT,Sharleen Szczesny W., PT 07/24/2016, 8:23 AM  [facility] [place] [place] [address] [place], [place], [address] Phone: [phone]   Fax:  [phone]  Name: Brian IronKevin L Toruno MRN: [id number] Date of Birth: 07/15/1971

## 2016-07-26 ENCOUNTER — Ambulatory Visit: Payer: Managed Care, Other (non HMO) | Admitting: Physical Therapy

## 2016-07-26 ENCOUNTER — Encounter: Payer: Self-pay | Admitting: Physical Therapy

## 2016-07-26 DIAGNOSIS — M62830 Muscle spasm of back: Secondary | ICD-10-CM

## 2016-07-26 DIAGNOSIS — M542 Cervicalgia: Secondary | ICD-10-CM | POA: Diagnosis not present

## 2016-07-26 NOTE — Therapy (Signed)
[facility] [address] [place], [place], [address] Phone: [phone]   Fax:  [phone]  Physical Therapy Treatment  Patient Details  Name: Brian IronKevin L On MRN: [id number] Date of Birth: 06/20/1971 Referring Provider: Dr. Ranell PatrickNorris  Encounter Date: 07/26/2016      PT End of Session - 07/26/16 0836    Visit Number 4   Date for PT Re-Evaluation 09/17/16   PT Start Time 0755   PT Stop Time 0845   PT Time Calculation (min) 50 min   Activity Tolerance Patient tolerated treatment well;No increased pain   Behavior During Therapy WFL for tasks assessed/performed      Past Medical History:  Diagnosis Date  . GERD (gastroesophageal reflux disease)   . Hypercholesteremia   . Pancreatitis     Past Surgical History:  Procedure Laterality Date  . CHOLECYSTECTOMY    . COLONOSCOPY      There were no vitals filed for this visit.      Subjective Assessment - 07/26/16 0802    Subjective Pt reports he is "hurting" however he does state he notices a change. He states that after therapy he finds relief for the rest of the day. The pain comes back the next day. Pt is scheduled for an MRI on January 5th and he may be receiving a coritsone injection depending on what they find.   Currently in Pain? Yes   Pain Score 3    Pain Location Neck                         [facility] Adult PT Treatment/Exercise - 07/26/16 0001      Neck Exercises: Machines for Strengthening   UBE (Upper Arm Bike) 2 min fwd, 2 min back   Cybex Row 25, 35, 45#, 3x10   Other Machines for Strengthening lat pulldown 35#, 3x10     Neck Exercises: Standing   Neck Retraction 10 reps  2 sets, green ball on wall     Shoulder Exercises: Standing   Flexion Strengthening;Right;Left;10 reps;Weights  1 set   Shoulder Flexion Weight (lbs) 5   ABduction Strengthening   Extension Strengthening;Both;10 reps;Theraband  3 sets   Theraband Level  (Shoulder Extension) Level 3 (Green)   Other Standing Exercises Shoulders flexed to 90 , 5 lb weight (sterring wheel turns)     Modalities   Modalities Traction     Traction   Type of Traction Cervical   Min (lbs) 15   Max (lbs) 13   Hold Time static   Time 15     Manual Therapy   Manual Therapy Passive ROM;Manual Traction   Passive ROM upper trap stretch   Manual Traction suboccipital release     Neck Exercises: Stretches   Upper Trapezius Stretch 10 seconds;2 reps                PT Education - 07/26/16 0836    Education provided No          PT Short Term Goals - 07/19/16 1550      PT SHORT TERM GOAL #1   Title Pt will demonstrtae proper recall of HEP.   Status Achieved           PT Long Term Goals - 07/17/16 1210      PT LONG TERM GOAL #1   Title Pt will report 50% decrease in worst pain rating.   Time 8  Period Weeks   Status New     PT LONG TERM GOAL #2   Title Pt will understand workspace and daily ergonomics in order to decrease stress felt on cervical neck region.   Time 8   Period Weeks   Status New     PT LONG TERM GOAL #3   Title Pt will be able to work a full day without reports of fatigue and symptom increase at the end of the day.   Time 8   Period Weeks   Status New               Plan - 07/26/16 [id number]    Clinical Impression Statement Pt tolerated treatment well and was able to complete all exercises without increased pain. He did become fatigued with shoulder flexion and abduction exercsies and reports feeling "tight" however he was able to finish the exercise. Pt reports that he is going to purchase the exact traction machine that is used in the clinic. Progress per pt tolerance.    Rehab Potential Good   PT Frequency 2x / week   PT Duration 8 weeks   PT Treatment/Interventions Moist Heat;Traction;ADLs/Self Care Home Management;Electrical Stimulation;Cryotherapy;Functional mobility training;Therapeutic  activities;Therapeutic exercise;Patient/family education;Passive range of motion;Manual techniques;Energy conservation   PT Next Visit Plan progress ex and continue with modalities   Consulted and Agree with Plan of Care Patient      Patient will benefit from skilled therapeutic intervention in order to improve the following deficits and impairments:  Pain, Postural dysfunction, Improper body mechanics, Increased muscle spasms, Impaired flexibility, Hypomobility, Decreased range of motion, Decreased mobility  Visit Diagnosis: Cervicalgia  Muscle spasm of back     Problem List There are no active problems to display for this patient.   Arsenio LoaderBrian Cade Goodellsley, SPT 07/26/2016, 8:40 AM  [facility] [place] [facility] [address] [place], [place], [address] Phone: [phone]   Fax:  [phone]  Name: Brian IronKevin L Downs MRN: [id number] Date of Birth: 06/19/1971

## 2016-07-31 ENCOUNTER — Ambulatory Visit: Payer: Managed Care, Other (non HMO) | Attending: Orthopedic Surgery | Admitting: Physical Therapy

## 2016-07-31 ENCOUNTER — Encounter: Payer: Self-pay | Admitting: Physical Therapy

## 2016-07-31 DIAGNOSIS — M62830 Muscle spasm of back: Secondary | ICD-10-CM | POA: Insufficient documentation

## 2016-07-31 DIAGNOSIS — M542 Cervicalgia: Secondary | ICD-10-CM | POA: Insufficient documentation

## 2016-07-31 NOTE — Therapy (Signed)
[facility] [address] [place], [place], [address] Phone: [phone]   Fax:  [phone]  Physical Therapy Treatment  Patient Details  Name: [name] MRN: [id number] Date of Birth: 04/12/1971 Referring Provider: Dr. Ranell PatrickNorris  Encounter Date: 07/31/2016      PT End of Session - 07/31/16 1737    Visit Number 5   Date for PT Re-Evaluation 09/17/16   PT Start Time 0500   PT Stop Time 0550   PT Time Calculation (min) 50 min   Activity Tolerance Patient tolerated treatment well;Patient limited by pain   Behavior During Therapy [facility] for tasks assessed/performed      Past Medical History:  Diagnosis Date  . GERD (gastroesophageal reflux disease)   . Hypercholesteremia   . Pancreatitis     Past Surgical History:  Procedure Laterality Date  . CHOLECYSTECTOMY    . COLONOSCOPY      There were no vitals filed for this visit.      Subjective Assessment - 07/31/16 1703    Subjective Pt reports he is doing "a little better". He went back to work today and states he changed his laptop out for a monitor that is more even with his eye level.    Currently in Pain? Yes   Pain Score 5    Pain Location Neck                         [facility] Adult PT Treatment/Exercise - 07/31/16 0001      Neck Exercises: Machines for Strengthening   UBE (Upper Arm Bike) 3 min fwd, 3 min back, lvl 2.5     Shoulder Exercises: Seated   Row 15 reps;Both;Strengthening;Weights   Row Weight (lbs) 45   Other Seated Exercises lat pulldowns 35#, x15  discontinued due to tingling in the last three fingertips   Other Seated Exercises wall pushups 2x15 on red ball     Modalities   Modalities Electrical Stimulation;Moist Heat;Traction     Moist Heat Therapy   Number Minutes Moist Heat 10 Minutes   Moist Heat Location Other (comment)  upper back and cervical     Electrical Stimulation   Electrical Stimulation Location left  scap and upper trap   Electrical Stimulation Action IFC   Electrical Stimulation Parameters supine   Electrical Stimulation Goals Pain     Traction   Type of Traction Cervical   Min (lbs) 15   Time 8     Manual Therapy   Manual Therapy Muscle Energy Technique   Muscle Energy Technique strain counterstrain technique perfromed on rhomboid musculature                PT Education - 07/31/16 1737    Education provided No          PT Short Term Goals - 07/19/16 1550      PT SHORT TERM GOAL #1   Title Pt will demonstrtae proper recall of HEP.   Status Achieved           PT Long Term Goals - 07/31/16 1747      PT LONG TERM GOAL #1   Status On-going     PT LONG TERM GOAL #2   Status Achieved     PT LONG TERM GOAL #3   Status On-going               Plan - 07/31/16 1737  Clinical Impression Statement Pt tolerated treatment well for the most part however he reports some tingling sensation down to the 3rd-5th digits on the dorsal side of the hand. Pt also reports pain on the medial aspect of the scapula, in the area of the rhomboids. Pt was given demonstration on how to relax the trigger points felt upon palpation with a tennis ball or lacrosse ball. Pt does not have any more appointments scheduled and was instructed to hold off on making anymore until the results from his MRI are received. He is scheduled for an MRI this Friday.    Rehab Potential Fair   PT Frequency 2x / week   PT Duration 8 weeks   PT Treatment/Interventions Moist Heat;Traction;ADLs/Self Care Home Management;Electrical Stimulation;Cryotherapy;Functional mobility training;Therapeutic activities;Therapeutic exercise;Patient/family education;Passive range of motion;Manual techniques;Energy conservation   PT Next Visit Plan progress ex and continue with modalities      Patient will benefit from skilled therapeutic intervention in order to improve the following deficits and impairments:   Pain, Postural dysfunction, Improper body mechanics, Increased muscle spasms, Impaired flexibility, Hypomobility, Decreased range of motion, Decreased mobility  Visit Diagnosis: Cervicalgia  Muscle spasm of back     Problem List There are no active problems to display for this patient.   Arsenio Loader Manor, SPT 07/31/2016, 5:48 PM  [facility] [address] [place], [place], [address] Phone: [phone]   Fax:  [phone]  Name: [name] MRN: [id number] Date of Birth: [birth year]

## 2016-08-10 ENCOUNTER — Ambulatory Visit: Payer: Self-pay | Admitting: Physician Assistant

## 2016-08-14 ENCOUNTER — Encounter (HOSPITAL_COMMUNITY): Payer: Self-pay | Admitting: *Deleted

## 2016-08-14 NOTE — Progress Notes (Signed)
Mr [name] reported that he had to be intubated with a colonoscopy in the past, "it was felt that I had sleep apnea, my wife says I snore, bu I do not stop breathing.  Stop Band Assessment tool  Score is 2.

## 2016-08-15 ENCOUNTER — Observation Stay (HOSPITAL_COMMUNITY)
Admission: RE | Admit: 2016-08-15 | Discharge: 2016-08-16 | Disposition: A | Discharge status: Home / Self Care | Payer: Managed Care, Other (non HMO) | Source: Ambulatory Visit | Attending: Orthopedic Surgery | Admitting: Orthopedic Surgery

## 2016-08-15 ENCOUNTER — Ambulatory Visit (HOSPITAL_COMMUNITY): Payer: Managed Care, Other (non HMO) | Admitting: Anesthesiology

## 2016-08-15 ENCOUNTER — Encounter (HOSPITAL_COMMUNITY)
Admission: RE | Disposition: A | Discharge status: Home / Self Care | Payer: Self-pay | Source: Ambulatory Visit | Attending: Orthopedic Surgery

## 2016-08-15 ENCOUNTER — Encounter (HOSPITAL_COMMUNITY): Payer: Self-pay | Admitting: Surgery

## 2016-08-15 ENCOUNTER — Ambulatory Visit (HOSPITAL_COMMUNITY): Payer: Managed Care, Other (non HMO)

## 2016-08-15 DIAGNOSIS — K219 Gastro-esophageal reflux disease without esophagitis: Secondary | ICD-10-CM | POA: Insufficient documentation

## 2016-08-15 DIAGNOSIS — E7800 Pure hypercholesterolemia, unspecified: Secondary | ICD-10-CM | POA: Insufficient documentation

## 2016-08-15 DIAGNOSIS — M50123 Cervical disc disorder at C6-C7 level with radiculopathy: Secondary | ICD-10-CM | POA: Diagnosis present

## 2016-08-15 DIAGNOSIS — M542 Cervicalgia: Secondary | ICD-10-CM | POA: Diagnosis present

## 2016-08-15 DIAGNOSIS — Z419 Encounter for procedure for purposes other than remedying health state, unspecified: Secondary | ICD-10-CM

## 2016-08-15 HISTORY — DX: Other complications of anesthesia, initial encounter: T88.59XA

## 2016-08-15 HISTORY — DX: Adverse effect of unspecified anesthetic, initial encounter: T41.45XA

## 2016-08-15 HISTORY — DX: Pneumonia, unspecified organism: J18.9

## 2016-08-15 HISTORY — PX: CERVICAL DISC ARTHROPLASTY: SHX587

## 2016-08-15 HISTORY — DX: Personal history of urinary calculi: Z87.442

## 2016-08-15 LAB — BASIC METABOLIC PANEL
Anion gap: 9 (ref 5–15)
BUN: 9 mg/dL (ref 6–20)
CHLORIDE: 107 mmol/L (ref 101–111)
CO2: 23 mmol/L (ref 22–32)
Calcium: 9.7 mg/dL (ref 8.9–10.3)
Creatinine, Ser: 0.74 mg/dL (ref 0.61–1.24)
GFR calc non Af Amer: >60 mL/min (ref >60)
Glucose, Bld: 112 mg/dL — ABNORMAL HIGH (ref 65–99)
POTASSIUM: 4.1 mmol/L (ref 3.5–5.1)
SODIUM: 139 mmol/L (ref 135–145)

## 2016-08-15 LAB — CBC
HCT: 49.8 % (ref 39.0–52.0)
HEMOGLOBIN: 17.6 g/dL — AB (ref 13.0–17.0)
MCH: 29.1 pg (ref 26.0–34.0)
MCHC: 35.3 g/dL (ref 30.0–36.0)
MCV: 82.5 fL (ref 78.0–100.0)
Platelets: 230 10*3/uL (ref 150–400)
RBC: 6.04 MIL/uL — ABNORMAL HIGH (ref 4.22–5.81)
RDW: 13.1 % (ref 11.5–15.5)
WBC: 6.7 10*3/uL (ref 4.0–10.5)

## 2016-08-15 SURGERY — CERVICAL ANTERIOR DISC ARTHROPLASTY
Anesthesia: General | Site: Neck

## 2016-08-15 MED ORDER — LACTATED RINGERS IV SOLN
INTRAVENOUS | Status: DC | PRN
  Administered 2016-08-15 (×2): via INTRAVENOUS

## 2016-08-15 MED ORDER — MORPHINE SULFATE (PF) 4 MG/ML IV SOLN: 2.0000 mg | INTRAVENOUS | Status: DC | PRN

## 2016-08-15 MED ORDER — ACETAMINOPHEN 10 MG/ML IV SOLN
1000.0000 mg | Freq: Four times a day (QID) | INTRAVENOUS | Status: DC
  Administered 2016-08-15 – 2016-08-16 (×2): 1000 mg via INTRAVENOUS
  Filled 2016-08-15 (×2): qty 100

## 2016-08-15 MED ORDER — ACETAMINOPHEN 650 MG RE SUPP: 650.0000 mg | RECTAL | Status: DC | PRN

## 2016-08-15 MED ORDER — DEXAMETHASONE SODIUM PHOSPHATE 4 MG/ML IJ SOLN: 4.0000 mg | Freq: Four times a day (QID) | INTRAMUSCULAR | Status: AC

## 2016-08-15 MED ORDER — METHOCARBAMOL 500 MG PO TABS: 500.0000 mg | ORAL_TABLET | Freq: Three times a day (TID) | ORAL | 0 refills | Status: DC | PRN

## 2016-08-15 MED ORDER — ACETAMINOPHEN 10 MG/ML IV SOLN
INTRAVENOUS | Status: AC
  Filled 2016-08-15: qty 100

## 2016-08-15 MED ORDER — SUGAMMADEX SODIUM 200 MG/2ML IV SOLN
INTRAVENOUS | Status: DC | PRN
  Administered 2016-08-15: 200 mg via INTRAVENOUS

## 2016-08-15 MED ORDER — ACETAMINOPHEN 325 MG PO TABS: 650.0000 mg | ORAL_TABLET | ORAL | Status: DC | PRN

## 2016-08-15 MED ORDER — THROMBIN 20000 UNITS EX SOLR
CUTANEOUS | Status: AC
  Filled 2016-08-15: qty 20000

## 2016-08-15 MED ORDER — OXYCODONE-ACETAMINOPHEN 10-325 MG PO TABS: 1.0000 | ORAL_TABLET | ORAL | 0 refills | Status: DC | PRN

## 2016-08-15 MED ORDER — LACTATED RINGERS IV SOLN: INTRAVENOUS | Status: DC

## 2016-08-15 MED ORDER — FENTANYL CITRATE (PF) 100 MCG/2ML IJ SOLN
INTRAMUSCULAR | Status: AC
  Filled 2016-08-15: qty 2

## 2016-08-15 MED ORDER — BUPIVACAINE-EPINEPHRINE 0.25% -1:200000 IJ SOLN
INTRAMUSCULAR | Status: DC | PRN
Start: 2016-08-15 — End: 2016-08-15
  Administered 2016-08-15: 7 mL

## 2016-08-15 MED ORDER — PROPOFOL 10 MG/ML IV BOLUS
INTRAVENOUS | Status: DC | PRN
  Administered 2016-08-15: 200 mg via INTRAVENOUS

## 2016-08-15 MED ORDER — SUCCINYLCHOLINE CHLORIDE 20 MG/ML IJ SOLN
INTRAMUSCULAR | Status: DC | PRN
  Administered 2016-08-15: 120 mg via INTRAVENOUS

## 2016-08-15 MED ORDER — METHOCARBAMOL 1000 MG/10ML IJ SOLN
500.0000 mg | Freq: Four times a day (QID) | INTRAVENOUS | Status: DC | PRN
  Filled 2016-08-15: qty 5

## 2016-08-15 MED ORDER — THROMBIN 20000 UNITS EX SOLR
CUTANEOUS | Status: DC | PRN
  Administered 2016-08-15: 20000 [IU] via TOPICAL

## 2016-08-15 MED ORDER — FENTANYL CITRATE (PF) 100 MCG/2ML IJ SOLN
INTRAMUSCULAR | Status: AC
  Filled 2016-08-15: qty 4

## 2016-08-15 MED ORDER — LACTATED RINGERS IV SOLN
INTRAVENOUS | Status: DC
  Administered 2016-08-15: 11:00:00 via INTRAVENOUS

## 2016-08-15 MED ORDER — DEXAMETHASONE SODIUM PHOSPHATE 10 MG/ML IJ SOLN
INTRAMUSCULAR | Status: DC | PRN
  Administered 2016-08-15: 10 mg via INTRAVENOUS

## 2016-08-15 MED ORDER — SUCCINYLCHOLINE CHLORIDE 200 MG/10ML IV SOSY
PREFILLED_SYRINGE | INTRAVENOUS | Status: AC
  Filled 2016-08-15: qty 10

## 2016-08-15 MED ORDER — POLYETHYLENE GLYCOL 3350 17 G PO PACK: 17.0000 g | PACK | Freq: Every day | ORAL | Status: DC | PRN

## 2016-08-15 MED ORDER — ARTIFICIAL TEARS OP OINT
TOPICAL_OINTMENT | OPHTHALMIC | Status: DC | PRN
  Administered 2016-08-15: 1 via OPHTHALMIC

## 2016-08-15 MED ORDER — ONDANSETRON HCL 4 MG/2ML IJ SOLN: 4.0000 mg | INTRAMUSCULAR | Status: DC | PRN

## 2016-08-15 MED ORDER — ROCURONIUM BROMIDE 100 MG/10ML IV SOLN
INTRAVENOUS | Status: DC | PRN
  Administered 2016-08-15: 50 mg via INTRAVENOUS
  Administered 2016-08-15: 10 mg via INTRAVENOUS
  Administered 2016-08-15 (×2): 20 mg via INTRAVENOUS

## 2016-08-15 MED ORDER — MENTHOL 3 MG MT LOZG
1.0000 | LOZENGE | OROMUCOSAL | Status: DC | PRN
Start: 2016-08-15 — End: 2016-08-16

## 2016-08-15 MED ORDER — ACETAMINOPHEN 10 MG/ML IV SOLN
INTRAVENOUS | Status: DC | PRN
  Administered 2016-08-15: 1000 mg via INTRAVENOUS

## 2016-08-15 MED ORDER — SODIUM CHLORIDE 0.9% FLUSH: 3.0000 mL | INTRAVENOUS | Status: DC | PRN

## 2016-08-15 MED ORDER — FENTANYL CITRATE (PF) 100 MCG/2ML IJ SOLN: 25.0000 ug | INTRAMUSCULAR | Status: DC | PRN

## 2016-08-15 MED ORDER — OXYCODONE HCL 5 MG PO TABS
10.0000 mg | ORAL_TABLET | ORAL | Status: DC | PRN
  Administered 2016-08-15: 10 mg via ORAL
  Filled 2016-08-15: qty 2

## 2016-08-15 MED ORDER — HEMOSTATIC AGENTS (NO CHARGE) OPTIME
TOPICAL | Status: DC | PRN
  Administered 2016-08-15 (×2): 1 via TOPICAL

## 2016-08-15 MED ORDER — MIDAZOLAM HCL 5 MG/5ML IJ SOLN
INTRAMUSCULAR | Status: DC | PRN
  Administered 2016-08-15: 2 mg via INTRAVENOUS

## 2016-08-15 MED ORDER — CEFAZOLIN SODIUM-DEXTROSE 2-4 GM/100ML-% IV SOLN
2.0000 g | Freq: Three times a day (TID) | INTRAVENOUS | Status: AC
  Administered 2016-08-15 – 2016-08-16 (×2): 2 g via INTRAVENOUS
  Filled 2016-08-15: qty 100

## 2016-08-15 MED ORDER — SUGAMMADEX SODIUM 200 MG/2ML IV SOLN
INTRAVENOUS | Status: AC
  Filled 2016-08-15: qty 2

## 2016-08-15 MED ORDER — CEFAZOLIN SODIUM-DEXTROSE 2-4 GM/100ML-% IV SOLN
2.0000 g | INTRAVENOUS | Status: AC
  Administered 2016-08-15: 2 g via INTRAVENOUS
  Filled 2016-08-15: qty 100

## 2016-08-15 MED ORDER — PROMETHAZINE HCL 25 MG/ML IJ SOLN: 6.2500 mg | INTRAMUSCULAR | Status: DC | PRN

## 2016-08-15 MED ORDER — DEXAMETHASONE 4 MG PO TABS
4.0000 mg | ORAL_TABLET | Freq: Four times a day (QID) | ORAL | Status: AC
  Administered 2016-08-15 – 2016-08-16 (×2): 4 mg via ORAL
  Filled 2016-08-15 (×2): qty 1

## 2016-08-15 MED ORDER — SODIUM CHLORIDE 0.9 % IV SOLN: 250.0000 mL | INTRAVENOUS | Status: DC

## 2016-08-15 MED ORDER — ONDANSETRON HCL 4 MG PO TABS: 4.0000 mg | ORAL_TABLET | Freq: Three times a day (TID) | ORAL | 0 refills | Status: DC | PRN

## 2016-08-15 MED ORDER — PROPOFOL 10 MG/ML IV BOLUS
INTRAVENOUS | Status: AC
  Filled 2016-08-15: qty 20

## 2016-08-15 MED ORDER — ROCURONIUM BROMIDE 50 MG/5ML IV SOSY
PREFILLED_SYRINGE | INTRAVENOUS | Status: AC
  Filled 2016-08-15: qty 5

## 2016-08-15 MED ORDER — METHOCARBAMOL 500 MG PO TABS
500.0000 mg | ORAL_TABLET | Freq: Four times a day (QID) | ORAL | Status: DC | PRN
  Administered 2016-08-15: 500 mg via ORAL
  Filled 2016-08-15: qty 1

## 2016-08-15 MED ORDER — KETOROLAC TROMETHAMINE 30 MG/ML IJ SOLN: 30.0000 mg | Freq: Once | INTRAMUSCULAR | Status: DC | PRN

## 2016-08-15 MED ORDER — PHENOL 1.4 % MT LIQD: 1.0000 | OROMUCOSAL | Status: DC | PRN

## 2016-08-15 MED ORDER — 0.9 % SODIUM CHLORIDE (POUR BTL) OPTIME
TOPICAL | Status: DC | PRN
  Administered 2016-08-15: 1000 mL

## 2016-08-15 MED ORDER — OXYCODONE HCL 5 MG PO TABS: 5.0000 mg | ORAL_TABLET | Freq: Once | ORAL | Status: DC | PRN

## 2016-08-15 MED ORDER — SODIUM CHLORIDE 0.9% FLUSH: 3.0000 mL | Freq: Two times a day (BID) | INTRAVENOUS | Status: DC

## 2016-08-15 MED ORDER — FENTANYL CITRATE (PF) 100 MCG/2ML IJ SOLN
INTRAMUSCULAR | Status: DC | PRN
  Administered 2016-08-15 (×4): 50 ug via INTRAVENOUS
  Administered 2016-08-15: 100 ug via INTRAVENOUS
  Administered 2016-08-15 (×4): 50 ug via INTRAVENOUS

## 2016-08-15 MED ORDER — OXYCODONE HCL 5 MG/5ML PO SOLN: 5.0000 mg | Freq: Once | ORAL | Status: DC | PRN

## 2016-08-15 MED ORDER — EPINEPHRINE PF 1 MG/ML IJ SOLN
INTRAMUSCULAR | Status: AC
  Filled 2016-08-15: qty 1

## 2016-08-15 MED ORDER — ONDANSETRON HCL 4 MG/2ML IJ SOLN
INTRAMUSCULAR | Status: DC | PRN
  Administered 2016-08-15: 4 mg via INTRAVENOUS

## 2016-08-15 MED ORDER — LIDOCAINE HCL (CARDIAC) 20 MG/ML IV SOLN
INTRAVENOUS | Status: DC | PRN
  Administered 2016-08-15: 100 mg via INTRAVENOUS

## 2016-08-15 MED ORDER — BUPIVACAINE HCL (PF) 0.25 % IJ SOLN
INTRAMUSCULAR | Status: AC
  Filled 2016-08-15: qty 30

## 2016-08-15 SURGICAL SUPPLY — 59 items
BIT MILLING PRODISC 2.0 STER (BIT) ×2 IMPLANT
BLADE SURG ROTATE 9660 (MISCELLANEOUS) IMPLANT
BUR EGG ELITE 4.0 (BURR) IMPLANT
BUR EGG ELITE 4.0MM (BURR)
BUR MATCHSTICK NEURO 3.0 LAGG (BURR) IMPLANT
CANISTER SUCTION 2500CC (MISCELLANEOUS) ×3 IMPLANT
CLOSURE STERI-STRIP 1/2X4 (GAUZE/BANDAGES/DRESSINGS) ×1
CLSR STERI-STRIP ANTIMIC 1/2X4 (GAUZE/BANDAGES/DRESSINGS) ×2 IMPLANT
COVER MAYO STAND STRL (DRAPES) ×6 IMPLANT
COVER SURGICAL LIGHT HANDLE (MISCELLANEOUS) ×6 IMPLANT
CRADLE DONUT ADULT HEAD (MISCELLANEOUS) ×3 IMPLANT
DISC PRODISC-C MED 6MM (Neuro Prosthesis/Implant) ×2 IMPLANT
DRAPE C-ARM 42X72 X-RAY (DRAPES) ×3 IMPLANT
DRAPE C-ARMOR (DRAPES) ×1 IMPLANT
DRAPE MICROSCOPE LEICA (MISCELLANEOUS) ×2 IMPLANT
DRAPE POUCH INSTRU U-SHP 10X18 (DRAPES) ×3 IMPLANT
DRAPE SURG 17X23 STRL (DRAPES) ×3 IMPLANT
DRAPE U-SHAPE 47X51 STRL (DRAPES) ×3 IMPLANT
DRSG MEPILEX BORDER 4X4 (GAUZE/BANDAGES/DRESSINGS) ×3 IMPLANT
DURAPREP 6ML APPLICATOR 50/CS (WOUND CARE) ×3 IMPLANT
ELECT COATED BLADE 2.86 ST (ELECTRODE) ×3 IMPLANT
ELECT PENCIL ROCKER SW 15FT (MISCELLANEOUS) ×3 IMPLANT
ELECT REM PT RETURN 9FT ADLT (ELECTROSURGICAL) ×3
ELECTRODE REM PT RTRN 9FT ADLT (ELECTROSURGICAL) ×1 IMPLANT
GLOVE BIOGEL PI IND STRL 6.5 (GLOVE) ×1 IMPLANT
GLOVE BIOGEL PI IND STRL 8.5 (GLOVE) ×1 IMPLANT
GLOVE BIOGEL PI INDICATOR 6.5 (GLOVE) ×2
GLOVE BIOGEL PI INDICATOR 8.5 (GLOVE) ×2
GLOVE SS BIOGEL STRL SZ 8.5 (GLOVE) ×1 IMPLANT
GLOVE SUPERSENSE BIOGEL SZ 8.5 (GLOVE) ×2
GOWN STRL REUS W/ TWL LRG LVL3 (GOWN DISPOSABLE) ×1 IMPLANT
GOWN STRL REUS W/TWL 2XL LVL3 (GOWN DISPOSABLE) ×6 IMPLANT
GOWN STRL REUS W/TWL LRG LVL3 (GOWN DISPOSABLE) ×3
KIT BASIN OR (CUSTOM PROCEDURE TRAY) ×3 IMPLANT
KIT ROOM TURNOVER OR (KITS) ×3 IMPLANT
NDL SPNL 18GX3.5 QUINCKE PK (NEEDLE) ×1 IMPLANT
NEEDLE SPNL 18GX3.5 QUINCKE PK (NEEDLE) ×3 IMPLANT
NS IRRIG 1000ML POUR BTL (IV SOLUTION) ×3 IMPLANT
PACK ORTHO CERVICAL (CUSTOM PROCEDURE TRAY) ×3 IMPLANT
PACK UNIVERSAL I (CUSTOM PROCEDURE TRAY) ×3 IMPLANT
PAD ARMBOARD 7.5X6 YLW CONV (MISCELLANEOUS) ×6 IMPLANT
PIN RETAINER PRODISC 14 MM (PIN) ×4 IMPLANT
RESTRAINT LIMB HOLDER UNIV (RESTRAINTS) ×3 IMPLANT
SPONGE INTESTINAL PEANUT (DISPOSABLE) IMPLANT
SPONGE SURGIFOAM ABS GEL 100 (HEMOSTASIS) ×3 IMPLANT
SURGIFLO W/THROMBIN 8M KIT (HEMOSTASIS) ×3 IMPLANT
SUT BONE WAX W31G (SUTURE) ×3 IMPLANT
SUT MNCRL AB 3-0 PS2 27 (SUTURE) ×3 IMPLANT
SUT SILK 3 0 TIES 17X18 (SUTURE) ×3
SUT SILK 3-0 18XBRD TIE BLK (SUTURE) ×1 IMPLANT
SUT VIC AB 2-0 CT1 18 (SUTURE) ×3 IMPLANT
SYR BULB IRRIGATION 50ML (SYRINGE) ×3 IMPLANT
SYR CONTROL 10ML LL (SYRINGE) IMPLANT
TAPE CLOTH 4X10 WHT NS (GAUZE/BANDAGES/DRESSINGS) ×3 IMPLANT
TAPE UMBILICAL COTTON 1/8X30 (MISCELLANEOUS) ×3 IMPLANT
TIP INSERTER MED DEEP IMPL 16 (Orthopedic Implant) ×2 IMPLANT
TOWEL OR 17X24 6PK STRL BLUE (TOWEL DISPOSABLE) ×3 IMPLANT
TOWEL OR 17X26 10 PK STRL BLUE (TOWEL DISPOSABLE) ×3 IMPLANT
WATER STERILE IRR 1000ML POUR (IV SOLUTION) ×3 IMPLANT

## 2016-08-15 NOTE — H&P (Signed)
History of Present Illness  The patient is a 46 year old male who presents with neck pain. The patient is seen today in referral from Dr Ranell Patrick and for a surgical consult. The patient reports symptoms involving the entire neck which began 2 month(s) ago. The symptoms began without any known injury. Symptoms include neck pain, neck stiffness, shoulder pain (left shoulder and scapula), arm numbness (left to the hand) and upper extremity weakness. The patient describes the pain as sharp. The symptoms occur constantly.The patient describes their symptoms as moderate in severity (4/10 now).The patient does feel that the symptoms are worsening (for 1 week). Symptoms are exacerbated by turning the head to the right and neck extension. Current treatment includes muscle relaxants (Skelaxin), opioid analgesics (Tylenol # 3, Motrin 800mg ) and heat. Pertinent medical history includes neck pain (flare up 1 year ago), while pertinent medical history does not include spinal surgery. Prior to being seen today the patient was previously evaluated in this clinic (Dr Ranell Patrick). Past evaluation has included cervical spine x-rays and cervical spine MRI ([facility] 08-03-16). Past treatment has included muscle relaxants, opioid analgesics and heat.   He is a pleasant gentleman who has had about two months of progressive debilitating neck and left arm pain.  Problem List/Past Medical Acute pain of left shoulder (M25.512)  Numbness of left hand (R20.0)  Cervical pain (M54.2)  Problems Reconciled   Allergies  Codeine and Related  Allergies Reconciled   Social History  Tobacco use  current every day smoker; smoke(d) 3/4 pack(s) per day Alcohol use  current drinker; drinks beer, wine and hard liquor; 5-7 per week Pain Contract  no Children  3 Current work status  working full time Drug/Alcohol Rehab (Currently)  no Drug/Alcohol Rehab (Previously)  no Exercise  Exercises weekly; does running / walking and individual  sport Illicit drug use  no Living situation  live with spouse Marital status  married Number of flights of stairs before winded  greater than 5 Tobacco / smoke exposure  yes outdoors only  Medication History  Norco (5-325MG  Tablet, 1-2 Tablet Oral every 4-6 hours as needed for pain, Taken starting 08/09/2016) Active. Skelaxin (800MG  Tablet, 1 (one) Tablet Oral 1 tab po TID prn spasms, Taken starting 08/09/2016) Active. Ibuprofen (800MG  Tablet, 1 (one) Tablet Oral 1 tab PO TID with food, Taken starting 08/09/2016) Active. EpiPen 2-Pak (0.3MG /0.3ML Soln Auto-inj, Injection) Active. Omeprazole (40MG  Capsule DR, Oral) Active. Simvastatin (20MG  Tablet, Oral) Active. Medications Reconciled  Past Surgical History Colon Polyp Removal - Colonoscopy  Gallbladder Surgery  laporoscopic Vasectomy   Other Problems Gastroesophageal Reflux Disease  Hypercholesterolemia   Objective Transcription Clinically, he has severe neck pain with range of motion. Positive Spurling sign into the left triceps (C7 dermatome). He has pain with extension, flexion and rotation of the cervical spine. No shortness of breath or chest pain. Lungs are clear to auscultation. Heart, regular rate and rhythm. He is alert and oriented x3. He has no significant shoulder, elbow or wrist pain with joint range of motion. He has no loss of bowel or bladder control. Abdomen is soft and nontender. Normal gait pattern. Negative Babinski test. No clonus. Negative Hoffmann sign. He has 3+/5 triceps strength on the left. The remainder of his upper extremity motor exam is 5/5. He has numbness and dysesthesias in the left C7 dermatome. He has 2+ deep tendon reflexes except for the left triceps, which is 1+.  RADIOGRAPHS His MRI from 08/03/2016 shows a large left disc herniation at C6-7  with mass effect on the left C7 nerve root. No significant facet disease at that level. He has moderate left foraminal disc complex. It  contacts, but does not deform, the C6 nerve root. He has mild disease as well at 3-4 and 4-5.  Assessment & Plan   Goal Of Surgery: Discussed that goal of surgery is to reduce pain and improve function and quality of life. Patient is aware that despite all appropriate treatment that there pain and function could be the same, worse, or different.  Anterior cervical fusion/disc replacement:Risks of surgery include, but are not limited to: Throat pain, swallowing difficulty, hoarseness or change in voice, death, stroke, paralysis, nerve root damage/injury, bleeding, blood clots, loss of bowel/bladder control, hardware failure, or mal-position, spinal fluid leak, adjacent segment disease, non-union, need for further surgery, ongoing or worse pain, infection. Post-operative bleeding or swelling that could require emergent surgery. In ability to place the disc due to technical factors  Assessments Transcription At this point in time, clinically, the patient's primary source of pain and discomfort is the left C6-7 disc herniation with C7 radicular symptoms. He has motor loss, sensory changes, and reflex changes. He has had an attempted physical therapy, which made him worse. He also had an oral dose of steroids, which did not help.  At this point in time, we have talked about injection therapy versus surgery. Given the severity of his symptoms and the motor deficits, I do not think injection therapy has a high chance of providing significant relief. We talked about fusion surgery and disc replacement surgery. More importantly, I have spent a significant amount of time talking about the need for smoking cessation. At this point, I think the disc replacement will provide us with the best option. He does not have a heavy labor-intense job. He is in [employer]. He occasionally lifts some of 50 to 75-pound objects, but that could be avoided. At this point, we reviewed the risks, which include infection, bleeding,  nerve damage, death, stroke, paralysis, failure to heal, need for further surgery, ongoing or worse pain, technical inability to place the disc and need to back out to a fusion, hardware complications, need for posterior surgery, throat pain, swallowing difficulties, hoarseness in the voice. All of his and his wife's questions were addressed. They were both present for the dictation.

## 2016-08-15 NOTE — Brief Op Note (Signed)
08/15/2016  4:47 PM  PATIENT:  [name]  46 y.o. male  PRE-OPERATIVE DIAGNOSIS:  C6 - C7 Disc Herniation  POST-OPERATIVE DIAGNOSIS:  C6 - C7 Disc Herniation  PROCEDURE:  Procedure(s): CERVICAL ANTERIOR DISC ARTHROPLASTY C6 - C7 (N/A)  SURGEON:  Surgeon(s) and Role:    * Venita Lickahari Cameryn Chrisley, MD - Primary  PHYSICIAN ASSISTANT:   ASSISTANTS: Carmen Mayo   ANESTHESIA:   general  EBL:  Total I/O In: 1000 [I.V.:1000] Out: -   BLOOD ADMINISTERED:none  DRAINS: none   LOCAL MEDICATIONS USED:  MARCAINE     SPECIMEN:  No Specimen  DISPOSITION OF SPECIMEN:  N/A  COUNTS:  YES  TOURNIQUET:  * No tourniquets in log *  DICTATION: .Other Dictation: Dictation Number [id number]  PLAN OF CARE: Admit for overnight observation  PATIENT DISPOSITION:  PACU - hemodynamically stable.

## 2016-08-15 NOTE — Discharge Instructions (Signed)

## 2016-08-15 NOTE — Anesthesia Preprocedure Evaluation (Addendum)
Anesthesia Evaluation  Patient identified by MRN, date of birth, ID band Patient awake    Reviewed: Allergy & Precautions, NPO status , Patient's Chart, lab work & pertinent test results  Airway Mallampati: II  TM Distance: >3 FB Neck ROM: Full    Dental no notable dental hx. (+) Teeth Intact, Dental Advidsory Given   Pulmonary Current Smoker,    Pulmonary exam normal breath sounds clear to auscultation       Cardiovascular negative cardio ROS Normal cardiovascular exam Rhythm:Regular Rate:Normal     Neuro/Psych negative neurological ROS  negative psych ROS   GI/Hepatic Neg liver ROS, GERD  Medicated,  Endo/Other  negative endocrine ROS  Renal/GU negative Renal ROS  negative genitourinary   Musculoskeletal negative musculoskeletal ROS (+)   Abdominal   Peds negative pediatric ROS (+)  Hematology negative hematology ROS (+)   Anesthesia Other Findings   Reproductive/Obstetrics negative OB ROS                            Anesthesia Physical Anesthesia Plan  ASA: II  Anesthesia Plan: General   Post-op Pain Management:    Induction: Intravenous  Airway Management Planned: Oral ETT  Additional Equipment:   Intra-op Plan:   Post-operative Plan: Extubation in OR  Informed Consent: I have reviewed the patients History and Physical, chart, labs and discussed the procedure including the risks, benefits and alternatives for the proposed anesthesia with the patient or authorized representative who has indicated his/her understanding and acceptance.   Dental advisory given and Dental Advisory Given  Plan Discussed with: CRNA, Surgeon and Anesthesiologist  Anesthesia Plan Comments:        Anesthesia Quick Evaluation

## 2016-08-15 NOTE — Anesthesia Postprocedure Evaluation (Signed)
Anesthesia Post Note  Patient: Luiz IronKevin L Rivest  Procedure(s) Performed: Procedure(s) (LRB): CERVICAL ANTERIOR DISC ARTHROPLASTY C6 - C7 (N/A)  Patient location during evaluation: PACU Anesthesia Type: General Level of consciousness: awake and alert Pain management: pain level controlled Vital Signs Assessment: post-procedure vital signs reviewed and stable Respiratory status: spontaneous breathing, nonlabored ventilation, respiratory function stable and patient connected to nasal cannula oxygen Cardiovascular status: blood pressure returned to baseline and stable Postop Assessment: no signs of nausea or vomiting Anesthetic complications: no       Last Vitals:  Vitals:   08/15/16 1730 08/15/16 1738  BP:  132/86  Pulse: 83 87  Resp: 13 14  Temp:      Last Pain:  Vitals:   08/15/16 1739  TempSrc:   PainSc: 0-No pain    LLE Motor Response: Purposeful movement (08/15/16 1739) LLE Sensation: Full sensation (08/15/16 1739) RLE Motor Response: Purposeful movement (08/15/16 1739) RLE Sensation: Full sensation (08/15/16 1739)      Kofi Murrell,Alvia DAVID

## 2016-08-15 NOTE — Transfer of Care (Signed)
Immediate Anesthesia Transfer of Care Note  Patient: [name]  Procedure(s) Performed: Procedure(s): CERVICAL ANTERIOR DISC ARTHROPLASTY C6 - C7 (N/A)  Patient Location: PACU  Anesthesia Type:General  Level of Consciousness: awake, alert , oriented and patient cooperative  Airway & Oxygen Therapy: Patient Spontanous Breathing and Patient connected to nasal cannula oxygen  Post-op Assessment: Report given to RN and Post -op Vital signs reviewed and stable mae x4  Post vital signs: Reviewed and stable  Last Vitals:  Vitals:   08/15/16 1003  BP: (!) 150/96  Pulse: 65  Resp: 20  Temp: 36.8 C    Last Pain:  Vitals:   08/15/16 1054  TempSrc:   PainSc: 7       Patients Stated Pain Goal: 5 (08/15/16 1054)  Complications: No apparent anesthesia complications

## 2016-08-15 NOTE — Anesthesia Procedure Notes (Signed)
Procedure Name: Intubation Date/Time: 08/15/2016 1:54 PM Performed by: Neldon Newport Pre-anesthesia Checklist: Timeout performed, Patient being monitored, Suction available, Emergency Drugs available and Patient identified Patient Re-evaluated:Patient Re-evaluated prior to inductionOxygen Delivery Method: Circle system utilized Preoxygenation: Pre-oxygenation with 100% oxygen Intubation Type: IV induction Ventilation: Mask ventilation without difficulty Laryngoscope Size: Mac, 4 and Glidescope Grade View: Grade III Tube type: Oral (anterior anatomy) Number of attempts: 2 Airway Equipment and Method: Video-laryngoscopy Placement Confirmation: breath sounds checked- equal and bilateral,  positive ETCO2 and ETT inserted through vocal cords under direct vision Secured at: 23 cm Tube secured with: Tape Dental Injury: Teeth and Oropharynx as per pre-operative assessment

## 2016-08-16 DIAGNOSIS — M50123 Cervical disc disorder at C6-C7 level with radiculopathy: Secondary | ICD-10-CM | POA: Diagnosis not present

## 2016-08-16 NOTE — Op Note (Signed)
[name]:  Downs, [name]                ACCOUNT NO.:  [id number]  MEDICAL RECORD NO.:  [id number]  LOCATION:  [facility]                         FACILITY:  [facility]  PHYSICIAN:  Amairany Schumpert D. Shon BatonBrooks, M.D. 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  POSTOPERATIVE DIAGNOSIS:  C6-7 disk herniation with left C7 radiculopathy.  OPERATIVE PROCEDURE:  Anterior cervical diskectomy and implantation of total disk arthroplasty C6-7.  COMPLICATIONS:  None.  CONDITION:  Stable.  FIRST ASSISTANT:  [facility].  HISTORY:  This is a very pleasant 46 year old gentleman who has had 2 months of progressive debilitating neck and left arm pain.  Despite conservative management, he continued to suffer severe pain and presented with motor and sensory deficits.  After discussing treatment options, we elected to proceed with surgery.  All appropriate risks, benefits, and alternatives were discussed with the patient and consent was obtained.  OPERATIVE NOTE:  The patient was brought to the operating room and placed supine on the operating table.  After successful induction of general anesthesia and endotracheal intubation, TEDs and SCDs were applied.  The arms were tucked at the side and the anterior cervical spine was prepped and draped in a standard fashion.  Time-out was taken confirming patient, procedure, and all other pertinent important data. Once that was completed, x-ray was used to identify the C6-7 disk space and it was marked out.  The transverse incision site was infiltrated with 0.25% Marcaine and then I made a transverse incision starting in the midline proceeding to the left.  Sharp dissection was carried out down to the platysma.  The platysma was sharply incised and I began dissecting along the medial border of the sternocleidomastoid down  to the deep cervical fascia.  I identified the omohyoid and sacrificed this in order for better visualization.  I then swept the trachea and esophagus off to the right, identified, and protected the carotid sheath laterally with my finger.  Using [employer] dissectors, I removed the remaining prevertebral fascia and exposed the anterior longitudinal ligament.  A needle was placed into the C6-7 disk space and an x-ray was taken confirming that I was at the appropriate level.  Once this was completed, using bipolar electrocautery, I mobilized the longus colli muscle from the midbody of 6 to the midbody of 7 bilaterally.  A self- retaining Caspar retractors were placed underneath the longus colli muscle.  The endotracheal cuff was deflated.  I expanded the retractors to the appropriate width and then reinflated the endotracheal cuff.  I confirmed midline position with the AP x-ray, marked it, and then placed 2 distraction pins, 1 in C6, 1 in C7.  Once the distraction pins were placed, I then performed an annulotomy with a 15-blade scalpel and then using the pituitary rongeurs, curettes, Kerrison rongeurs, I removed the bulk of the disk material.  I then distracted the intervertebral space and maintained the distraction with the distraction pin set.  I continued to work posteriorly using the microscope.  Under microscopic visualization, I continued dissecting posteriorly.  I then  released the posterior annulus and trimmed down the osteophyte from the posterior aspect of the C6 and C7 vertebral body.  In the left hand corner using a nerve hook, I gently dissected through the PLL until I was able to get underneath the fragment of disk material.  I then used my 1-mm Kerrison rongeur to resect the PLL and then my nerve hook to deliver the disk fragment out into the disk space and remove it with my micro pituitary rongeur.  Once the disk fragment was removed, I then continued resecting the PLL over  to the right-hand side.  At this point, I then undercut the uncovertebral joints just to further decompress the nerve root.  At this point, with the diskectomy complete and a decompression complete, I then trialed the ProDisk intervertebral spacers.  I elected to use the size 6 medium implant.  This provided me adequate anterior- posterior and lateral positioning.  Once I confirmed, I was in the midline, I then placed the jig over the trial and then used a high-speed bur to create 2 thin cuts.  I then cleaned the pins, irrigated the wound, and checked posteriorly with microscopic visualization to ensure I had no fragments of bone dislodged posteriorly.  I then obtained the implant and malleted to the appropriate depth.  Final imaging studies demonstrated satisfactory position of the implant in both the AP and lateral planes.  I removed the distraction pins and placed bone wax in the holes to obtain hemostasis.  I used bipolar electrocautery and FloSeal to obtain the remainder of the hemostasis in the wound.  I irrigated the wound copiously with normal saline and then returned the trachea and esophagus to midline.  Now that all the distraction hardware was out, I took final AP and lateral views.  In the AP view, my implant was perfectly centered and I had adequate space.  In the lateral view, the depth was satisfactory.  I then closed the platysma with interrupted 2-0 Vicryl sutures and then a 3-0 Monocryl for the skin.  Steri-Strips and dry dressing were applied, and the patient was ultimately extubated and transferred to the PACU without incident.  At the end of the case, needle and sponge counts were correct.     [name], M.D.     DDB/MEDQ  D:  08/15/2016  T:  08/16/2016  Job:  [id number]  cc:   Dr. Shon Downs' office

## 2016-08-16 NOTE — Evaluation (Signed)
Physical Therapy Evaluation and Discharge Patient Details Name: [name] MRN: [id number] DOB: 18-Jul-1971 Today's Date: 08/16/2016   History of Present Illness  Pt is a 46 y/o male who presents s/p C6-C7 ACDF on 08/15/16.  Clinical Impression  Patient evaluated by Physical Therapy with no further acute PT needs identified. All education has been completed and the patient has no further questions. At the time of PT eval pt was able to perform transfers and ambulation with independence and no observed unsteadiness/LOB. See below for any follow-up Physical Therapy or equipment needs. PT is signing off. Thank you for this referral.     Follow Up Recommendations No PT follow up    Equipment Recommendations  None recommended by PT    Recommendations for Other Services       Precautions / Restrictions Precautions Precautions: Fall;Cervical Precaution Comments: Provided precautions sheet and pt was cued during functional mobility.  Required Braces or Orthoses: Cervical Brace Cervical Brace: Soft collar Restrictions Weight Bearing Restrictions: No      Mobility  Bed Mobility Overal bed mobility: Independent                Transfers Overall transfer level: Independent Equipment used: None                Ambulation/Gait Ambulation/Gait assistance: Independent Ambulation Distance (Feet): 400 Feet Assistive device: None Gait Pattern/deviations: WFL(Within Functional Limits)   Gait velocity interpretation: at or above normal speed for age/gender    Stairs         General stair comments: Pt declined to practice - states he feels comfortable negotiating stairs.   Wheelchair Mobility    Modified Rankin (Stroke Patients Only)       Balance Overall balance assessment: No apparent balance deficits (not formally assessed)                                           Pertinent Vitals/Pain Pain Assessment: Faces Faces Pain Scale: Hurts a  little bit Pain Location: Incision site Pain Descriptors / Indicators: Operative site guarding Pain Intervention(s): Limited activity within patient's tolerance;Monitored during session;Repositioned    Home Living Family/patient expects to be discharged to:: Private residence Living Arrangements: Spouse/significant other Available Help at Discharge: Family Type of Home: House Home Access: Stairs to enter   Secretary/administrator of Steps: 2 Home Layout: One level Home Equipment: None      Prior Function Level of Independence: Independent               Hand Dominance   Dominant Hand: Right    Extremity/Trunk Assessment   Upper Extremity Assessment Upper Extremity Assessment: Defer to OT evaluation    Lower Extremity Assessment Lower Extremity Assessment: Overall WFL for tasks assessed    Cervical / Trunk Assessment Cervical / Trunk Assessment: Normal (s/p surgery)  Communication   Communication: No difficulties  Cognition Arousal/Alertness: Awake/alert Behavior During Therapy: WFL for tasks assessed/performed Overall Cognitive Status: Within Functional Limits for tasks assessed                      General Comments      Exercises     Assessment/Plan    PT Assessment Patent does not need any further PT services  PT Problem List            PT Treatment Interventions  PT Goals (Current goals can be found in the Care Plan section)  Acute Rehab PT Goals PT Goal Formulation: All assessment and education complete, DC therapy    Frequency     Barriers to discharge        Co-evaluation               End of Session Equipment Utilized During Treatment: Cervical collar Activity Tolerance: Patient tolerated treatment well Patient left: in bed;with call bell/phone within reach;with family/visitor present Nurse Communication: Mobility status    Functional Assessment Tool Used: Clinical judgement Functional Limitation: Mobility:  Walking and moving around Mobility: Walking and Moving Around Current Status ([id number](G8978): At least 1 percent but less than 20 percent impaired, limited or restricted Mobility: Walking and Moving Around Goal Status [phone](G8979): At least 1 percent but less than 20 percent impaired, limited or restricted Mobility: Walking and Moving Around Discharge Status [phone]([phone]): At least 1 percent but less than 20 percent impaired, limited or restricted    Time: 0728-0738 PT Time Calculation (min) (ACUTE ONLY): 10 min   Charges:   PT Evaluation $PT Eval Low Complexity: 1 Procedure     PT G Codes:   PT G-Codes **NOT FOR INPATIENT CLASS** Functional Assessment Tool Used: Clinical judgement Functional Limitation: Mobility: Walking and moving around Mobility: Walking and Moving Around Current Status ([phone]([id number]): At least 1 percent but less than 20 percent impaired, limited or restricted Mobility: Walking and Moving Around Goal Status [phone]): At least 1 percent but less than 20 percent impaired, limited or restricted Mobility: Walking and Moving Around Discharge Status [phone]([id number]): At least 1 percent but less than 20 percent impaired, limited or restricted    Marylynn PearsonLaura D Cedrica Brune 08/16/2016, 9:10 AM   Conni SlipperLaura Desi Rowe, PT, DPT Acute Rehabilitation Services Pager: [phone]

## 2016-08-16 NOTE — Progress Notes (Signed)
    Subjective: 1 Day Post-Op Procedure(s) (LRB): CERVICAL ANTERIOR DISC ARTHROPLASTY C6 - C7 (N/A) Patient reports pain as 0 on 0-10 scale.   Denies CP or SOB.  Voiding without difficulty. Positive flatus. Objective: Vital signs in last 24 hours: Temp:  [97 F (36.1 C)-99.2 F (37.3 C)] 97.6 F (36.4 C) (01/18 0731) Pulse Rate:  [50-103] 50 (01/18 0731) Resp:  [8-20] 18 (01/18 0731) BP: (97-187)/(50-96) 110/74 (01/18 0731) SpO2:  [91 %-98 %] 98 % (01/18 0731) Weight:  [91.2 kg (201 lb)] 91.2 kg (201 lb) (01/17 1003)  Intake/Output from previous day: 01/17 0701 - 01/18 0700 In: 1940 [P.O.:240; I.V.:1700] Out: 100 [Blood:100] Intake/Output this shift: No intake/output data recorded.  Labs:  Recent Labs  08/15/16 1103  HGB 17.6*    Recent Labs  08/15/16 1103  WBC 6.7  RBC 6.04*  HCT 49.8  PLT 230    Recent Labs  08/15/16 1103  NA 139  K 4.1  CL 107  CO2 23  BUN 9  CREATININE 0.74  GLUCOSE 112*  CALCIUM 9.7   No results for input(s): LABPT, INR in the last 72 hours.  Physical Exam: Neurologically intact ABD soft Sensation intact distally Dorsiflexion/Plantar flexion intact Incision: no drainage Compartment soft  Assessment/Plan: 1 Day Post-Op Procedure(s) (LRB): CERVICAL ANTERIOR DISC ARTHROPLASTY C6 - C7 (N/A) Pt has already worked with PT and is cleared for DC Pt will DC home Meds on charge to take with him Pt understands to present to clinic in 2 weeks   [name], Baxter Kailarmen Christina for Dr. Venita Lickahari Brooks [facility] [phone] 08/16/2016, 7:40 AM

## 2016-08-16 NOTE — Progress Notes (Signed)
Patient alert and oriented, mae's well, voiding adequate amount of urine, swallowing without difficulty, no c/o pain. Patient discharged home with family. Script and discharged instructions given to patient. Patient and family stated understanding of d/c instructions given and has an appointment with MD. 

## 2016-08-20 NOTE — Discharge Summary (Signed)
Physician Discharge Summary  Patient ID: [name] Heavin MRN: [id number] DOB/AGE: 44/10/1970 44 y.o.  Admit date: 08/15/2016 Discharge date: 08/20/2016  Admission Diagnoses:  Cervical HNP  Discharge Diagnoses:  Active Problems:   Neck pain   Past Medical History:  Diagnosis Date  . Complication of anesthesia    had to be intubatedd with colonoscopy, they indicated that he might have sleep apnea, has not been tested, STOP Bang Assessment Score is 2  . GERD (gastroesophageal reflux disease)   . History of kidney stones    passed  . Hypercholesteremia   . Pancreatitis   . Pneumonia 2003    Surgeries: Procedure(s): CERVICAL ANTERIOR DISC ARTHROPLASTY C6 - C7 on 08/15/2016   Consultants (if any):   Discharged Condition: Improved  Hospital Course: [name] Aye is an 46 y.o. male who was admitted 08/15/2016 with a diagnosis of Cervical HNP and went to the operating room on 08/15/2016 and underwent the above named procedures.  Post op day 1 pt denies incisional pain or arm pain.  Pt is declining medication for pain while inpatient.  Pt is voiding w/o difficulty.  The pt is ambulating in the hallway.   He was given perioperative antibiotics:  Anti-infectives    Start     Dose/Rate Route Frequency Ordered Stop   08/15/16 2200  ceFAZolin (ANCEF) IVPB 2g/100 mL premix     2 g 200 mL/hr over 30 Minutes Intravenous Every 8 hours 08/15/16 1826 08/16/16 0535   08/15/16 1030  ceFAZolin (ANCEF) IVPB 2g/100 mL premix     2 g 200 mL/hr over 30 Minutes Intravenous 30 min pre-op 08/15/16 1030 08/15/16 1359    .  He was given sequential compression devices, early ambulation, and TED for DVT prophylaxis.  He benefited maximally from the hospital stay and there were no complications.    Recent vital signs:  Vitals:   08/16/16 0400 08/16/16 0731  BP: 110/73 110/74  Pulse: (!) 59 (!) 50  Resp: 18 18  Temp: 97.6 F (36.4 C) 97.6 F (36.4 C)    Recent laboratory studies:  Lab Results   Component Value Date   HGB 17.6 (H) 08/15/2016   HGB 15.7 01/16/2014   Lab Results  Component Value Date   WBC 6.7 08/15/2016   PLT 230 08/15/2016   No results found for: INR Lab Results  Component Value Date   NA 139 08/15/2016   K 4.1 08/15/2016   CL 107 08/15/2016   CO2 23 08/15/2016   BUN 9 08/15/2016   CREATININE 0.74 08/15/2016   GLUCOSE 112 (H) 08/15/2016    Discharge Medications:   Allergies as of 08/16/2016      Reactions   Codeine Nausea Only      Medication List    STOP taking these medications   HYDROcodone-acetaminophen 5-325 MG tablet Commonly known as:  NORCO/VICODIN   metaxalone 800 MG tablet Commonly known as:  SKELAXIN     TAKE these medications   cetirizine 10 MG tablet Commonly known as:  ZYRTEC Take 10 mg by mouth daily as needed for allergies.   methocarbamol 500 MG tablet Commonly known as:  ROBAXIN Take 1 tablet (500 mg total) by mouth 3 (three) times daily as needed for muscle spasms.   omeprazole 20 MG capsule Commonly known as:  PRILOSEC Take 20 mg by mouth daily.   ondansetron 4 MG tablet Commonly known as:  ZOFRAN Take 1 tablet (4 mg total) by mouth every 8 (eight) hours as needed  for nausea or vomiting.   oxyCODONE-acetaminophen 10-325 MG tablet Commonly known as:  PERCOCET Take 1 tablet by mouth every 4 (four) hours as needed for pain.   simvastatin 20 MG tablet Commonly known as:  ZOCOR Take 20 mg by mouth every evening.       Diagnostic Studies: Dg Cervical Spine 2-3 Views  Result Date: 08/15/2016 CLINICAL DATA:  C6-7 disc replacement. EXAM: DG C-ARM 61-120 MIN; CERVICAL SPINE - 2-3 VIEW COMPARISON:  None. FINDINGS: Standard time-out procedure was performed. Fluoroscopic time utilized during C6-7 disc replacement surgery was 1 minutes and 11 seconds. AP and lateral views were acquired using the C-arm fluoroscope. These images demonstrate a C6-7 replacement disc device at the C6-7 level. No hardware failure nor  fracture identified. Osteophytes are noted anteriorly at C4-5 and C5-6. IMPRESSION: IMPRESSION C-arm fluoroscopic time utilized for a C6-7 discs replacement with hardware in place at the C6-7 interspace. Electronically Signed   By: Tollie Eth M.D.   On: 08/15/2016 18:11   Dg C-arm 61-120 Min  Result Date: 08/15/2016 CLINICAL DATA:  C6-7 disc replacement. EXAM: DG C-ARM 61-120 MIN; CERVICAL SPINE - 2-3 VIEW COMPARISON:  None. FINDINGS: Standard time-out procedure was performed. Fluoroscopic time utilized during C6-7 disc replacement surgery was 1 minutes and 11 seconds. AP and lateral views were acquired using the C-arm fluoroscope. These images demonstrate a C6-7 replacement disc device at the C6-7 level. No hardware failure nor fracture identified. Osteophytes are noted anteriorly at C4-5 and C5-6. IMPRESSION: IMPRESSION C-arm fluoroscopic time utilized for a C6-7 discs replacement with hardware in place at the C6-7 interspace. Electronically Signed   By: Tollie Eth M.D.   On: 08/15/2016 18:11    Disposition: 01-Home or Self Care Pt will present to clinic in 2 weeks Post op medications provided for the pt  Discharge Instructions    Incentive spirometry RT    Complete by:  As directed       Follow-up Information    BROOKS,DAHARI D, MD. Schedule an appointment as soon as possible for a visit in 2 week(s).   Specialty:  Orthopedic Surgery Why:  If symptoms worsen, For suture removal, For wound re-check Contact information: [address] [place] [address] [phone]            Signed: Kirt Boys 08/20/2016, 1:40 PM

## 2016-08-21 ENCOUNTER — Encounter (HOSPITAL_COMMUNITY): Payer: Self-pay | Admitting: Orthopedic Surgery

## 2016-11-01 ENCOUNTER — Encounter: Payer: Self-pay | Admitting: Physical Therapy

## 2016-11-01 ENCOUNTER — Ambulatory Visit: Payer: Managed Care, Other (non HMO) | Attending: Orthopedic Surgery | Admitting: Physical Therapy

## 2016-11-01 DIAGNOSIS — M542 Cervicalgia: Secondary | ICD-10-CM | POA: Diagnosis present

## 2016-11-01 NOTE — Therapy (Signed)
[facility] [address] [place], [place], [address] Phone: [phone]   Fax:  [phone]  Physical Therapy Evaluation  Patient Details  Name: [name] MRN: [id number] Date of Birth: Feb 22, 1971 Referring Provider: Dr. Shon Baton  Encounter Date: 11/01/2016      PT End of Session - 11/01/16 0826    Visit Number 1   Date for PT Re-Evaluation 01/01/17   PT Start Time 0756   PT Stop Time 0830   PT Time Calculation (min) 34 min   Activity Tolerance Patient tolerated treatment well   Behavior During Therapy [facility] for tasks assessed/performed      Past Medical History:  Diagnosis Date  . Complication of anesthesia    had to be intubatedd with colonoscopy, they indicated that he might have sleep apnea, has not been tested, STOP Bang Assessment Score is 2  . GERD (gastroesophageal reflux disease)   . History of kidney stones    passed  . Hypercholesteremia   . Pancreatitis   . Pneumonia 2003    Past Surgical History:  Procedure Laterality Date  . Birth mark     revision Forehead  . CERVICAL DISC ARTHROPLASTY N/A 08/15/2016   Procedure: CERVICAL ANTERIOR DISC ARTHROPLASTY C6 - C7;  Surgeon: Venita Lick, MD;  Location: [facility] OR;  Service: Orthopedics;  Laterality: N/A;  . CHOLECYSTECTOMY    . COLONOSCOPY    . COLONOSCOPY W/ POLYPECTOMY      There were no vitals filed for this visit.       Subjective Assessment - 11/01/16 0757    Subjective Patient was seen here in November with neck pain nad mm spasms.  He did not get much better and had an MRI.  The MRI showed a disc that was in "pieces and pushing on a nerve" .  He underwent cervical disc arthroplasty at C6-7 on 08/15/16.  He reports that his recovery has been going very well, having minimal pain.   Patient Stated Goals get back to normal   Currently in Pain? No/denies            [facility] PT Assessment - 11/01/16 0001      Assessment   Medical Diagnosis  S/P cervcial disc arthroplasty   Referring Provider Dr. Jacki Cones Dominance Right   Next MD Visit 08/15/16   Prior Therapy yes     Precautions   Precaution Comments no lifting overhead, no lifting > 25#     Balance Screen   Has the patient fallen in the past 6 months No   Has the patient had a decrease in activity level because of a fear of falling?  No   Is the patient reluctant to leave their home because of a fear of falling?  No     Home Environment   Additional Comments does some yardwork, and some housework     Prior Function   Vocation Full time employment   Market researcher, sititng behind desk, flying on planes alot   Leisure riding dirt bikes with kids     Posture/Postural Control   Posture Comments mild fwd head and rounded shoulders     ROM / Strength   AROM / PROM / Strength Strength     AROM   Overall AROM Comments Cervical ROM was WNL's for flexion and extension, rotation and side bending was decreased 25% with c/o tightness, shoulder ROM was WNL's     Strength  Overall Strength Comments 4/5 but I did not push to the max due to recent surgery     Palpation   Palpation comment minimal tightness in the upper traps and the cervical p-spinals                           PT Education - 11/01/16 0825    Education provided Yes   Education Details Went over all gym exercises and safety, focus is on posture and form, went over nothing ever behind head, went over no lifting overhead, gave green tband and HEP   Person(s) Educated Patient   Methods Explanation;Demonstration;Handout   Comprehension Verbalized understanding;Returned demonstration          PT Short Term Goals - 11/01/16 0829      PT SHORT TERM GOAL #1   Title Pt will demonstrtae proper recall of HEP.   Time 1   Period Days   Status Achieved           PT Long Term Goals - 07/31/16 1747      PT LONG TERM GOAL #1   Status On-going     PT LONG TERM  GOAL #2   Status Achieved     PT LONG TERM GOAL #3   Status On-going               Plan - 11/01/16 0827    Clinical Impression Statement Patient underwent a C6-7 Disc arthroplasty on 08/15/16.  He is doing great , only minor tightness iwth rotation and side bending.  He is having no pain, has good strength in the UE's.  Minimla tightness in the upper traps and cervical area.  We went over proper form and posture and how to do gym equipment safely, told him not to do crunches, no over head lifting and never anything behind the neck with lifting.  He agreed and understood the mechanics   Rehab Potential Good   PT Frequency One time visit   PT Next Visit Plan He is doing great and is going to try on his own   Consulted and Agree with Plan of Care Patient      Patient will benefit from skilled therapeutic intervention in order to improve the following deficits and impairments:  Pain, Postural dysfunction, Improper body mechanics, Increased muscle spasms, Impaired flexibility, Hypomobility, Decreased range of motion, Decreased mobility  Visit Diagnosis: Cervicalgia - Plan: PT plan of care cert/re-cert     Problem List Patient Active Problem List   Diagnosis Date Noted  . Neck pain 08/15/2016    Jearld Lesch., PT 11/01/2016, 8:31 AM  [facility] [place] [facility] [address] [place], [place], [address] Phone: [phone]   Fax:  [phone]  Name: [name] MRN: [id number] Date of Birth: 02-06-1971

## 2017-04-01 ENCOUNTER — Emergency Department (HOSPITAL_BASED_OUTPATIENT_CLINIC_OR_DEPARTMENT_OTHER): Payer: 59

## 2017-04-01 ENCOUNTER — Emergency Department (HOSPITAL_BASED_OUTPATIENT_CLINIC_OR_DEPARTMENT_OTHER)
Admission: EM | Admit: 2017-04-01 | Discharge: 2017-04-01 | Disposition: A | Discharge status: Home / Self Care | Payer: 59 | Attending: Emergency Medicine | Admitting: Emergency Medicine

## 2017-04-01 ENCOUNTER — Encounter (HOSPITAL_BASED_OUTPATIENT_CLINIC_OR_DEPARTMENT_OTHER): Payer: Self-pay

## 2017-04-01 DIAGNOSIS — F17210 Nicotine dependence, cigarettes, uncomplicated: Secondary | ICD-10-CM | POA: Insufficient documentation

## 2017-04-01 DIAGNOSIS — Y93H3 Activity, building and construction: Secondary | ICD-10-CM | POA: Insufficient documentation

## 2017-04-01 DIAGNOSIS — W228XXA Striking against or struck by other objects, initial encounter: Secondary | ICD-10-CM | POA: Insufficient documentation

## 2017-04-01 DIAGNOSIS — M79604 Pain in right leg: Secondary | ICD-10-CM

## 2017-04-01 DIAGNOSIS — S8991XA Unspecified injury of right lower leg, initial encounter: Secondary | ICD-10-CM | POA: Diagnosis present

## 2017-04-01 DIAGNOSIS — S80811A Abrasion, right lower leg, initial encounter: Secondary | ICD-10-CM | POA: Diagnosis not present

## 2017-04-01 DIAGNOSIS — Y929 Unspecified place or not applicable: Secondary | ICD-10-CM | POA: Diagnosis not present

## 2017-04-01 DIAGNOSIS — Y999 Unspecified external cause status: Secondary | ICD-10-CM | POA: Diagnosis not present

## 2017-04-01 DIAGNOSIS — M79661 Pain in right lower leg: Secondary | ICD-10-CM | POA: Diagnosis not present

## 2017-04-01 MED ORDER — IBUPROFEN 800 MG PO TABS: 800.0000 mg | ORAL_TABLET | Freq: Three times a day (TID) | ORAL | 0 refills | Status: AC | PRN

## 2017-04-01 MED ORDER — KETOROLAC TROMETHAMINE 30 MG/ML IJ SOLN
30.0000 mg | Freq: Once | INTRAMUSCULAR | Status: AC
  Administered 2017-04-01: 30 mg via INTRAMUSCULAR
  Filled 2017-04-01: qty 1

## 2017-04-01 NOTE — ED Triage Notes (Signed)
Pt states a door fell on the back of right LE-abrasions, avulsion noted to left lower calf/heel area-grimacing in pain-presents to triage in w/c

## 2017-04-01 NOTE — ED Notes (Signed)
Pt to XR at this time

## 2017-04-01 NOTE — Discharge Instructions (Signed)

## 2017-04-01 NOTE — ED Provider Notes (Signed)
Emergency Department Provider Note   I have reviewed the triage vital signs and the nursing notes.   HISTORY  Chief Complaint Leg Injury   HPI [name] is a 46 y.o. male with PMH of GERD and HLD presents to the emergency department for evaluation of right lower leg injury after having a door fall on his leg. The patient states he was installing a door when the door and casing fell over on him. He denies any head injury or loss of consciousness. The door mainly fell on his right lower leg. He had severe pain in that area and some bleeding. He was not pinned under the door. He denies any numbness or tingling. Family notes abrasion to the posterior right calf with some bruising and a small amount of bruising over the foot.  Past Medical History:  Diagnosis Date  . Complication of anesthesia    had to be intubatedd with colonoscopy, they indicated that he might have sleep apnea, has not been tested, STOP Bang Assessment Score is 2  . GERD (gastroesophageal reflux disease)   . History of kidney stones    passed  . Hypercholesteremia   . Pancreatitis   . Pneumonia 2003    Patient Active Problem List   Diagnosis Date Noted  . Neck pain 08/15/2016    Past Surgical History:  Procedure Laterality Date  . Birth mark     revision Forehead  . CERVICAL DISC ARTHROPLASTY N/A 08/15/2016   Procedure: CERVICAL ANTERIOR DISC ARTHROPLASTY C6 - C7;  Surgeon: Venita Lick, MD;  Location: [facility] OR;  Service: Orthopedics;  Laterality: N/A;  . CHOLECYSTECTOMY    . COLONOSCOPY    . COLONOSCOPY W/ POLYPECTOMY      Current Outpatient Rx  . Order #: [id number] Class: Print  . Order #: [id number] Class: Historical Med  . Order #: [id number] Class: Historical Med    Allergies Codeine  No family history on file.  Social History Social History  Substance Use Topics  . Smoking status: Current Every Day Smoker    Packs/day: 0.60    Years: 20.00    Types: Cigarettes  . Smokeless tobacco:  Never Used  . Alcohol use Yes     Comment: socially    Review of Systems  Constitutional: No fever/chills Eyes: No visual changes. ENT: No sore throat. Cardiovascular: Denies chest pain. Respiratory: Denies shortness of breath. Gastrointestinal: No abdominal pain.  No nausea, no vomiting.  No diarrhea.  No constipation. Genitourinary: Negative for dysuria. Musculoskeletal: Negative for back pain. Positive right leg pain.  Skin: Negative for rash. Neurological: Negative for headaches, focal weakness or numbness.  10-point ROS otherwise negative.  ____________________________________________   PHYSICAL EXAM:  VITAL SIGNS: ED Triage Vitals  Enc Vitals Group     BP 04/01/17 1158 110/68     Pulse Rate 04/01/17 1158 72     Resp 04/01/17 1158 20     Temp 04/01/17 1159 98.3 F (36.8 C)     Temp Source 04/01/17 1158 Oral     SpO2 04/01/17 1158 96 %     Weight 04/01/17 1158 205 lb (93 kg)     Height 04/01/17 1158 5\' 10"  (1.778 m)     Pain Score 04/01/17 1156 10   Constitutional: Alert and oriented. Well appearing and in no acute distress. Eyes: Conjunctivae are normal.  Head: Atraumatic. Nose: No congestion/rhinnorhea. Mouth/Throat: Mucous membranes are moist.   Neck: No stridor.  Cardiovascular: Good peripheral circulation. Respiratory: Normal respiratory effort.  Gastrointestinal:  No distention.  Musculoskeletal: Right posterior calf and ankle tenderness to palpation. Swelling to the right later foot without significant tenderness. Normal knee exam. No other injury in other extremities.  Neurologic:  Normal speech and language. No gross focal neurologic deficits are appreciated.  Skin:  Skin is warm, dry and intact. Abrasion/minor avulsion of the posterior right lower calf. No laceration.   ____________________________________________  RADIOLOGY  Dg Ankle 2 Views Right  Result Date: 04/01/2017 CLINICAL DATA:  46 year old who had a door fall onto the right foot and  ankle. Lateral foot and ankle pain. Initial encounter. EXAM: RIGHT ANKLE - 2 VIEW COMPARISON:  None. FINDINGS: No evidence of acute fracture or dislocation. 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 Ankle mortise intact with well-preserved joint space. No visible joint effusion. IMPRESSION: 1. No acute osseous abnormality. 2. Accessory ossicle adjacent to the lateral aspect of the distal tibial versus an unfused cleft epiphysis from childhood. An avulsion fracture is not suspected as the margins are well corticated. Electronically Signed   By: Hulan Saas M.D.   On: 04/01/2017 12:52   Dg Foot 2 Views Right  Result Date: 04/01/2017 CLINICAL DATA:  46 year old who had a door fall onto the right foot and ankle. Lateral foot and ankle pain. Initial encounter. EXAM: RIGHT FOOT - 2 VIEW COMPARISON:  None. FINDINGS: No evidence of acute fracture or dislocation. Well-preserved joint spaces. Well-preserved bone mineral density. Bone island in the talus. Small enthesopathic spur at the insertion of the Achilles tendon on the posterior calcaneus. IMPRESSION: No acute or significant osseous abnormality. Electronically Signed   By: Hulan Saas M.D.   On: 04/01/2017 12:53    ____________________________________________   PROCEDURES  Procedure(s) performed:   Procedures  ULTRASOUND LIMITED SOFT TISSUE/ MUSCULOSKELETAL:  Indication: Left ankle/calf bruising Linear probe used to evaluate area of interest in two planes. Findings:  No interruption or edema surrounding the achilles tendon.  Performed by: Dr Jacqulyn Bath Images saved electronically  ____________________________________________   INITIAL IMPRESSION / ASSESSMENT AND PLAN / ED COURSE  Pertinent labs & imaging results that were available during my care of the patient were reviewed by me and considered in my medical decision making (see chart  for details).  Patient resents to the emergency department for evaluation of right lower extremity injury. He has an abrasion faint avulsion to the right lower calf. Some bruising around the ankle with tenderness there. Also with some bruising over the right lateral foot without significant tenderness. Plan for x-rays of both these areas, Toradol for pain control, and bedside ultrasound of the Achilles tendon.  No acute findings on plain film. Normal bedside US of the achilles tendon. Plan for basic wound care, WBAT, crutches PRN, and NSAIDs at home. Provided PCP and Sports Med follow up PRN. Tetanus updated last week at PCP.   At this time, I do not feel there is any life-threatening condition present. I have reviewed and discussed all results (EKG, imaging, lab, urine as appropriate), exam findings with patient. I have reviewed nursing notes and appropriate previous records.  I feel the patient is safe to be discharged home without further emergent workup. Discussed usual and customary return precautions. Patient and family (if present) verbalize understanding and are comfortable with this plan.  Patient will follow-up with their primary care provider. If they do not have a primary care provider, information for follow-up has been provided  to them. All questions have been answered.  ____________________________________________  FINAL CLINICAL IMPRESSION(S) / ED DIAGNOSES  Final diagnoses:  Right leg pain  Abrasion of right lower extremity, initial encounter     MEDICATIONS GIVEN DURING THIS VISIT:  Medications  ketorolac (TORADOL) 30 MG/ML injection 30 mg (30 mg Intramuscular Given 04/01/17 1228)     NEW OUTPATIENT MEDICATIONS STARTED DURING THIS VISIT:  Discharge Medication List as of 04/01/2017  1:35 PM    START taking these medications   Details  ibuprofen (ADVIL,MOTRIN) 800 MG tablet Take 1 tablet (800 mg total) by mouth every 8 (eight) hours as needed., Starting Mon 04/01/2017, Print         Note:  This document was prepared using Dragon voice recognition software and may include unintentional dictation errors.  Alona BeneJoshua Long, MD Emergency Medicine    Long, Arlyss RepressJoshua G, MD 04/01/17 [phone]

## 2018-11-22 IMAGING — DX DG FOOT 2V*R*
2 series · 2 of 2 positions shown · non-contrast
Comparison: None.

CLINICAL DATA: 46-year-old who had a door fall onto the right foot
and ankle. Lateral foot and ankle pain. Initial encounter.

EXAM:
RIGHT FOOT - 2 VIEW

[foot ap]
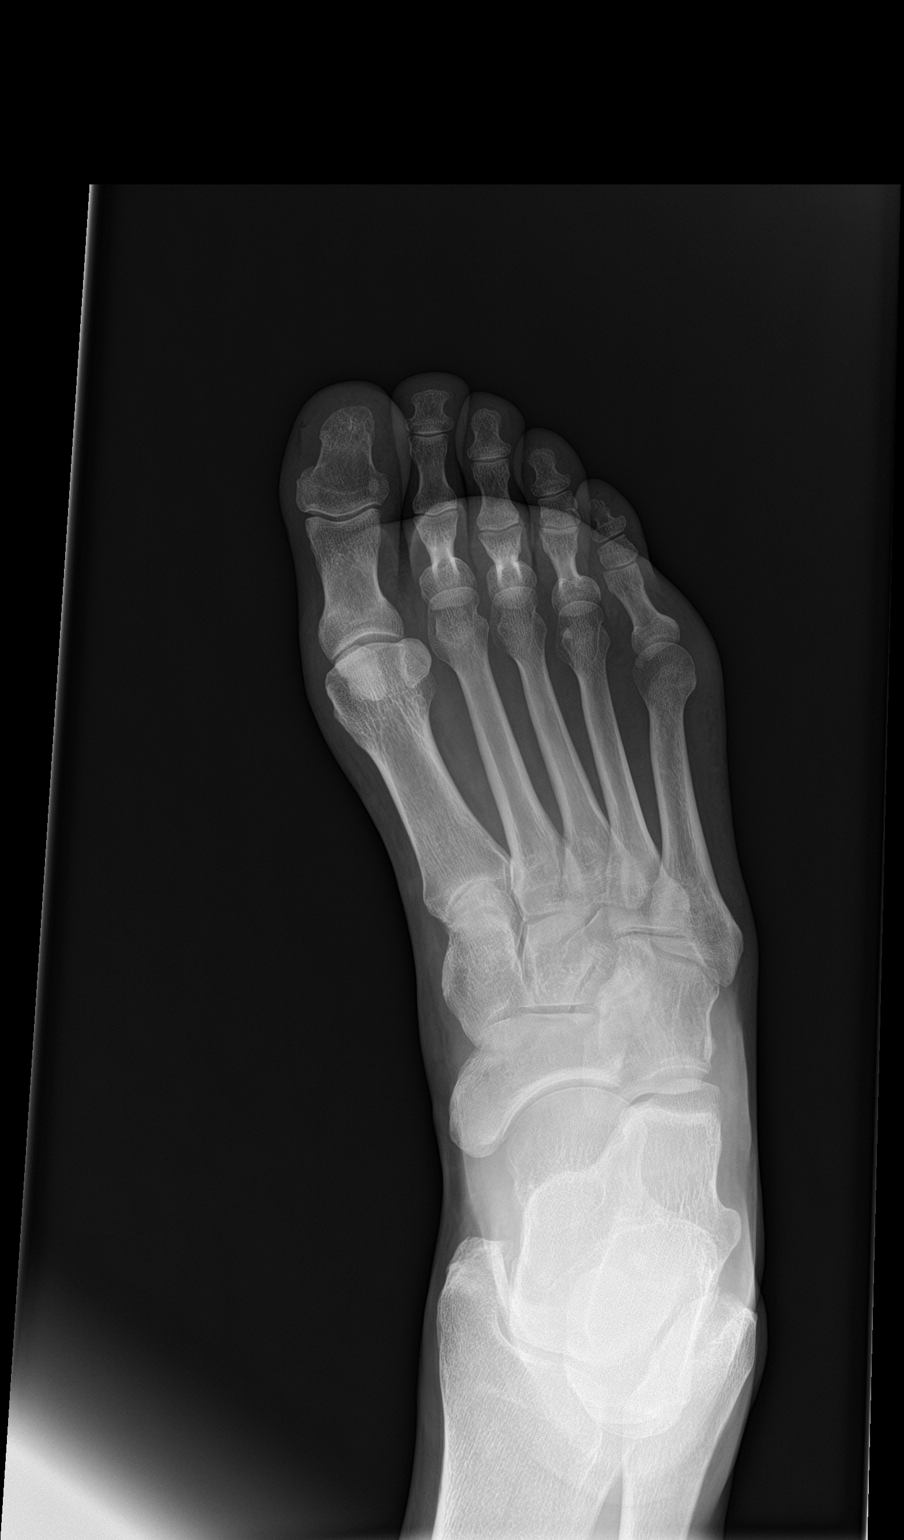

[foot lat]
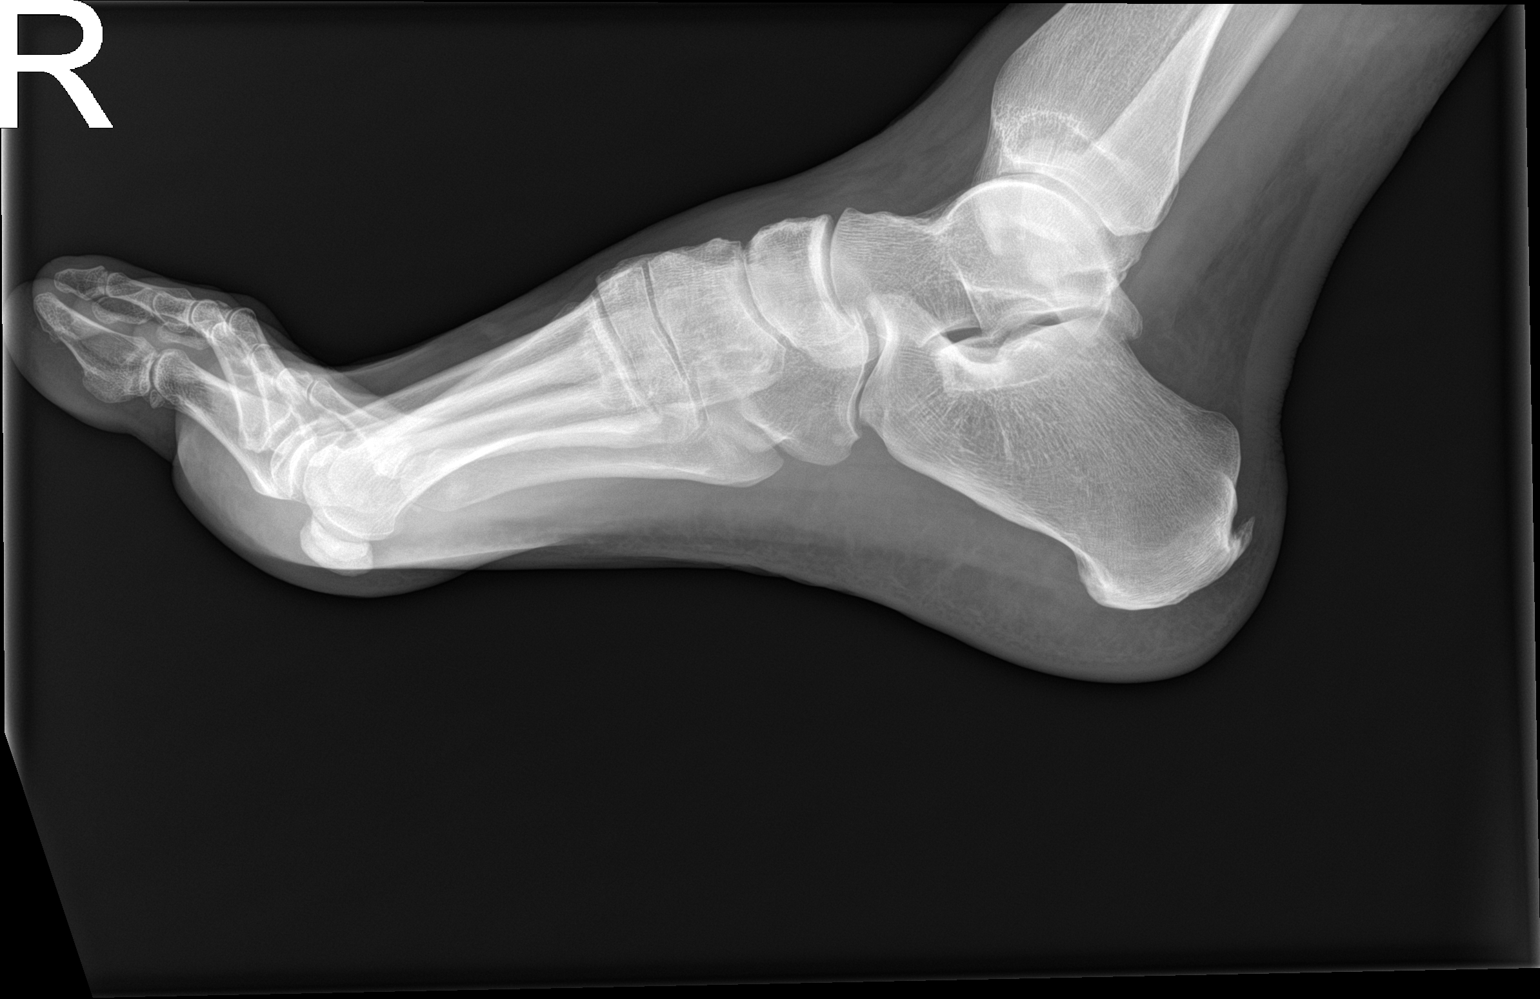

[2 of 2 positions shown; findings below may reference images not displayed]

FINDINGS: No evidence of acute fracture or dislocation. Well-preserved joint
spaces. Well-preserved bone mineral density. Bone island in the
talus. Small enthesopathic spur at the insertion of the Achilles
tendon on the posterior calcaneus.
IMPRESSION: No acute or significant osseous abnormality.

## 2022-09-10 ENCOUNTER — Ambulatory Visit: Payer: No Typology Code available for payment source | Admitting: Nurse Practitioner

## 2022-09-10 ENCOUNTER — Encounter: Payer: Self-pay | Admitting: Nurse Practitioner

## 2022-09-10 VITALS — BP 112/80 | HR 86 | Ht 70.0 in | Wt 199.2 lb

## 2022-09-10 DIAGNOSIS — R0683 Snoring: Secondary | ICD-10-CM | POA: Diagnosis not present

## 2022-09-10 NOTE — Progress Notes (Signed)
$@[email or web address]$  ID: [name], male    DOB: Nov 20, 1970, 52 y.o.   MRN: [id number]  Chief Complaint  Patient presents with   Consult    Pt sleep consult, referred for snoring and restless sleep also possible apneas reported by wife. Never had a sleep study or used a CPAP machine    Referring provider: Katherina Mires, MD  HPI: 52 year old male, active smoker referred for sleep consult. Past medical history significant for GERD, HLD, DM.  TEST/EVENTS:   09/10/2022: Today - sleep consult Patient presents today for sleep consult, referred by Dr. Doreene Nest. He has a long history of snoring. His wife has told him that he stops breathing at night when he sleeps. Wakes poorly rested and with a dry mouth. Occasionally has some daytime fatigue but able to complete activities without trouble. Denies morning headaches, drowsy driving, sleep parasomnias/paralysis. No symptoms of narcolepsy or cataplexy. He goes to bed between 9-10 pm. Falls asleep within 15 min. Wakes typically once a night. Gets up around 6:30 am. No sleep aids. 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 No previous sleep studies. He has a history of diabetes. No history of stroke. He is an active smoker; 1 ppd. Does not drink alcohol. He does drink caffeine throughout the day; usually a 16 oz coffee in the AM then 2-3 soft drinks and tea throughout the day. He lives at home with his spouse and kids. Works in [employer], academic. Travels 2-3 times a month. Family history of emphysema, allergies, cancer.   Epworth 11   Allergies  Allergen Reactions   Codeine Nausea Only    Immunization History  Administered Date(s) Administered   PFIZER(Purple Top)SARS-COV-2 Vaccination 10/27/2019, 11/24/2019, 07/28/2020    Past Medical History:  Diagnosis Date   Complication of anesthesia    had to be intubatedd with colonoscopy, they indicated that he might have sleep apnea, has not been tested, STOP Bang Assessment Score is 2    GERD (gastroesophageal reflux disease)    History of kidney stones    passed   Hypercholesteremia    Pancreatitis    Pneumonia 2003    Tobacco History: Social History   Tobacco Use  Smoking Status Every Day   Packs/day: 1.00   Years: 27.00   Total pack years: 27.00   Types: Cigarettes   Start date: 01/31/1996  Smokeless Tobacco Never  Tobacco Comments   Pt repots 1ppd   HEI, 09/13/2022   Ready to quit: Not Answered Counseling given: Not Answered Tobacco comments: Pt repots 1ppd HEI, 09/13/2022   Outpatient Medications Prior to Visit  Medication Sig Dispense Refill   empagliflozin (JARDIANCE) 25 MG TABS tablet Take 25 mg by mouth daily.     ibuprofen (ADVIL,MOTRIN) 800 MG tablet Take 1 tablet (800 mg total) by mouth every 8 (eight) hours as needed. 21 tablet 0   omeprazole (PRILOSEC) 20 MG capsule Take 20 mg by mouth daily.  3   rosuvastatin (CRESTOR) 40 MG tablet Take 40 mg by mouth daily.     simvastatin (ZOCOR) 20 MG tablet Take 20 mg by mouth every evening.     No facility-administered medications prior to visit.     Review of Systems:   Constitutional: No weight loss or gain, night sweats, fevers, chills, or lassitude. +daytime fatigue  HEENT: No headaches, difficulty swallowing, tooth/dental problems, or sore throat. No sneezing, itching, ear ache, nasal congestion, or post nasal drip CV:  No chest pain, orthopnea, PND,  swelling in lower extremities, anasarca, dizziness, palpitations, syncope Resp: +snoring, witnessed apneas, chronic productive cough. No shortness of breath with exertion or at rest. No excess mucus or change in color of mucus. No hemoptysis. No wheezing.  No chest wall deformity GI:  +occasional heartburn, indigestion. No abdominal pain, nausea, vomiting, diarrhea, change in bowel habits, loss of appetite, bloody stools.  GU: No dysuria, change in color of urine, urgency or frequency.   Skin: No rash, lesions, ulcerations MSK:  No joint pain or  swelling.   Neuro: No dizziness or lightheadedness.  Psych: No depression or anxiety. Mood stable.     Physical Exam:  BP 112/80   Pulse 86   Ht 5' 10"$  (1.778 m)   Wt 199 lb 3.2 oz (90.4 kg)   SpO2 94%   BMI 28.58 kg/m   GEN: Pleasant, interactive, well-appearing; in no acute distress. HEENT:  Normocephalic and atraumatic. PERRLA. Sclera white. Nasal turbinates pink, moist and patent bilaterally. No rhinorrhea present. Oropharynx pink and moist, without exudate or edema. No lesions, ulcerations, or postnasal drip. Mallampati II/III NECK:  Supple w/ fair ROM. No JVD present. Normal carotid impulses w/o bruits. Thyroid symmetrical with no goiter or nodules palpated. No lymphadenopathy.   CV: RRR, no m/r/g, no peripheral edema. Pulses intact, +2 bilaterally. No cyanosis, pallor or clubbing. PULMONARY:  Unlabored, regular breathing. Clear bilaterally A&P w/o wheezes/rales/rhonchi. No accessory muscle use.  GI: BS present and normoactive. Soft, non-tender to palpation. No organomegaly or masses detected.  MSK: No erythema, warmth or tenderness. Cap refil <2 sec all extrem. No deformities or joint swelling noted.  Neuro: A/Ox3. No focal deficits noted.   Skin: Warm, no lesions or rashe Psych: Normal affect and behavior. Judgement and thought content appropriate.     Lab Results:  CBC    Component Value Date/Time   WBC 6.7 08/15/2016 1103   RBC 6.04 (H) 08/15/2016 1103   HGB 17.6 (H) 08/15/2016 1103   HCT 49.8 08/15/2016 1103   PLT 230 08/15/2016 1103   MCV 82.5 08/15/2016 1103   MCH 29.1 08/15/2016 1103   MCHC 35.3 08/15/2016 1103   RDW 13.1 08/15/2016 1103    BMET    Component Value Date/Time   NA 139 08/15/2016 1103   K 4.1 08/15/2016 1103   CL 107 08/15/2016 1103   CO2 23 08/15/2016 1103   GLUCOSE 112 (H) 08/15/2016 1103   BUN 9 08/15/2016 1103   CREATININE 0.74 08/15/2016 1103   CALCIUM 9.7 08/15/2016 1103   GFRNONAA >60 08/15/2016 1103   GFRAA >60 08/15/2016  1103    BNP No results found for: "BNP"   Imaging:  No results found.        No data to display          No results found for: "NITRICOXIDE"      Assessment & Plan:   Snoring He has snoring, excessive daytime sleepiness, nocturnal apneic events, poor sleep quality. Epworth 11. Given this,  I am concerned he could have sleep disordered breathing with obstructive sleep apnea. He will need sleep study for further evaluation.    - discussed how weight can impact sleep and risk for sleep disordered breathing - discussed options to assist with weight loss: combination of diet modification, cardiovascular and strength training exercises   - had an extensive discussion regarding the adverse health consequences related to untreated sleep disordered breathing - specifically discussed the risks for hypertension, coronary artery disease, cardiac dysrhythmias, cerebrovascular disease, and diabetes -  lifestyle modification discussed   - discussed how sleep disruption can increase risk of accidents, particularly when driving - safe driving practices were discussed  Patient Instructions  Given your symptoms, I am concerned that you may have sleep disordered breathing with sleep apnea. You will need a sleep study for further evaluation. Someone will contact you to schedule this.  We discussed how untreated sleep apnea puts an individual at risk for cardiac arrhthymias, pulm HTN, DM, stroke and increases their risk for daytime accidents. We also briefly reviewed treatment options including weight loss, side sleeping position, oral appliance, CPAP therapy or referral to ENT for possible surgical options  Use caution when driving and pull over if you become sleepy  Follow up after your home sleep study with Katie Ala Capri,NP, or sooner, if needed    I spent 35 minutes of dedicated to the care of this patient on the date of this encounter to include pre-visit review of records, face-to-face  time with the patient discussing conditions above, post visit ordering of testing, clinical documentation with the electronic health record, making appropriate referrals as documented, and communicating necessary findings to members of the patients care team.  Clayton Bibles, NP 09/10/2022  Pt aware and understands NP's role.

## 2022-09-10 NOTE — Patient Instructions (Addendum)
Given your symptoms, I am concerned that you may have sleep disordered breathing with sleep apnea. You will need a sleep study for further evaluation. Someone will contact you to schedule this.  We discussed how untreated sleep apnea puts an individual at risk for cardiac arrhthymias, pulm HTN, DM, stroke and increases their risk for daytime accidents. We also briefly reviewed treatment options including weight loss, side sleeping position, oral appliance, CPAP therapy or referral to ENT for possible surgical options  Use caution when driving and pull over if you become sleepy  Follow up after your home sleep study with Katie Mally Gavina,NP, or sooner, if needed

## 2022-09-10 NOTE — Assessment & Plan Note (Signed)
He has snoring, excessive daytime sleepiness, nocturnal apneic events, poor sleep quality. Epworth 11. Given this,  I am concerned he could have sleep disordered breathing with obstructive sleep apnea. He will need sleep study for further evaluation.    - discussed how weight can impact sleep and risk for sleep disordered breathing - discussed options to assist with weight loss: combination of diet modification, cardiovascular and strength training exercises   - had an extensive discussion regarding the adverse health consequences related to untreated sleep disordered breathing - specifically discussed the risks for hypertension, coronary artery disease, cardiac dysrhythmias, cerebrovascular disease, and diabetes - lifestyle modification discussed   - discussed how sleep disruption can increase risk of accidents, particularly when driving - safe driving practices were discussed  Patient Instructions  Given your symptoms, I am concerned that you may have sleep disordered breathing with sleep apnea. You will need a sleep study for further evaluation. Someone will contact you to schedule this.  We discussed how untreated sleep apnea puts an individual at risk for cardiac arrhthymias, pulm HTN, DM, stroke and increases their risk for daytime accidents. We also briefly reviewed treatment options including weight loss, side sleeping position, oral appliance, CPAP therapy or referral to ENT for possible surgical options  Use caution when driving and pull over if you become sleepy  Follow up after your home sleep study with Katie Anthonyjames Bargar,NP, or sooner, if needed

## 2022-10-24 ENCOUNTER — Ambulatory Visit (INDEPENDENT_AMBULATORY_CARE_PROVIDER_SITE_OTHER): Payer: No Typology Code available for payment source | Admitting: Nurse Practitioner

## 2022-10-24 DIAGNOSIS — R0683 Snoring: Secondary | ICD-10-CM

## 2022-10-24 DIAGNOSIS — G4733 Obstructive sleep apnea (adult) (pediatric): Secondary | ICD-10-CM

## 2022-10-29 NOTE — Progress Notes (Signed)
Please notify patient his sleep study 10/11/2022 showed moderate OSA with AHI 22.5/h, SpO2 low 82%. Needs follow up to discuss results/treatment options. Thanks.

## 2022-10-31 NOTE — Progress Notes (Signed)
ATC patient x1.  LVM to return call. 

## 2022-12-06 NOTE — Progress Notes (Signed)
10/11/2022 showed moderate OSA with AHI 22.5/h, SpO2 low 82%. He was called previously but still hasn't had a VM. Can we see if we can get him set up for a video visit tomorrow to discuss? Thanks!

## 2023-10-06 ENCOUNTER — Encounter: Payer: Self-pay | Admitting: Nurse Practitioner
